# Patient Record
Sex: Female | Born: 1935 | Race: White | Hispanic: No | Marital: Married | Smoking: Current every day smoker
Health system: Southern US, Community
[De-identification: ages and names within clinical notes are randomized; demographics above are authoritative.]

## PROBLEM LIST (undated history)

## (undated) DIAGNOSIS — E079 Disorder of thyroid, unspecified: Secondary | ICD-10-CM

## (undated) DIAGNOSIS — M1990 Unspecified osteoarthritis, unspecified site: Secondary | ICD-10-CM

## (undated) HISTORY — PX: BREAST SURGERY: SHX581

## (undated) HISTORY — PX: FRACTURE SURGERY: SHX138

## (undated) HISTORY — DX: Disorder of thyroid, unspecified: E07.9

## (undated) HISTORY — PX: ABDOMINAL HYSTERECTOMY: SHX81

## (undated) HISTORY — PX: APPENDECTOMY: SHX54

## (undated) HISTORY — DX: Unspecified osteoarthritis, unspecified site: M19.90

---

## 1999-07-06 ENCOUNTER — Encounter: Payer: Self-pay | Admitting: Family Medicine

## 1999-07-06 ENCOUNTER — Encounter: Admission: RE | Admit: 1999-07-06 | Discharge: 1999-07-06 | Payer: Self-pay | Admitting: Family Medicine

## 1999-09-05 ENCOUNTER — Encounter: Admission: RE | Admit: 1999-09-05 | Discharge: 1999-09-05 | Payer: Self-pay | Admitting: Family Medicine

## 1999-09-05 ENCOUNTER — Encounter: Payer: Self-pay | Admitting: Family Medicine

## 2000-09-11 ENCOUNTER — Encounter: Payer: Self-pay | Admitting: Family Medicine

## 2000-09-11 ENCOUNTER — Encounter: Admission: RE | Admit: 2000-09-11 | Discharge: 2000-09-11 | Payer: Self-pay | Admitting: Family Medicine

## 2001-09-16 ENCOUNTER — Encounter: Payer: Self-pay | Admitting: Family Medicine

## 2001-09-16 ENCOUNTER — Encounter: Admission: RE | Admit: 2001-09-16 | Discharge: 2001-09-16 | Payer: Self-pay | Admitting: Family Medicine

## 2002-09-30 ENCOUNTER — Encounter: Payer: Self-pay | Admitting: Family Medicine

## 2002-09-30 ENCOUNTER — Encounter: Admission: RE | Admit: 2002-09-30 | Discharge: 2002-09-30 | Payer: Self-pay | Admitting: Family Medicine

## 2003-10-05 ENCOUNTER — Encounter: Admission: RE | Admit: 2003-10-05 | Discharge: 2003-10-05 | Payer: Self-pay | Admitting: Family Medicine

## 2004-10-18 ENCOUNTER — Encounter: Admission: RE | Admit: 2004-10-18 | Discharge: 2004-10-18 | Payer: Self-pay | Admitting: Family Medicine

## 2005-10-23 ENCOUNTER — Encounter: Admission: RE | Admit: 2005-10-23 | Discharge: 2005-10-23 | Payer: Self-pay | Admitting: Family Medicine

## 2006-10-15 ENCOUNTER — Encounter: Admission: RE | Admit: 2006-10-15 | Discharge: 2006-10-15 | Payer: Self-pay | Admitting: Family Medicine

## 2006-10-26 ENCOUNTER — Encounter: Admission: RE | Admit: 2006-10-26 | Discharge: 2006-10-26 | Payer: Self-pay | Admitting: Family Medicine

## 2007-11-27 ENCOUNTER — Encounter: Admission: RE | Admit: 2007-11-27 | Discharge: 2007-11-27 | Payer: Self-pay | Admitting: Family Medicine

## 2007-12-06 ENCOUNTER — Encounter: Admission: RE | Admit: 2007-12-06 | Discharge: 2007-12-06 | Payer: Self-pay | Admitting: Family Medicine

## 2008-11-30 ENCOUNTER — Encounter: Admission: RE | Admit: 2008-11-30 | Discharge: 2008-11-30 | Payer: Self-pay | Admitting: Family Medicine

## 2009-01-21 ENCOUNTER — Ambulatory Visit (HOSPITAL_COMMUNITY): Admission: RE | Admit: 2009-01-21 | Discharge: 2009-01-22 | Payer: Self-pay | Admitting: Obstetrics and Gynecology

## 2009-12-06 ENCOUNTER — Encounter: Admission: RE | Admit: 2009-12-06 | Discharge: 2009-12-06 | Payer: Self-pay | Admitting: Family Medicine

## 2010-09-18 ENCOUNTER — Encounter: Payer: Self-pay | Admitting: Family Medicine

## 2010-09-28 ENCOUNTER — Other Ambulatory Visit: Payer: Self-pay | Admitting: Family Medicine

## 2010-09-28 DIAGNOSIS — Z1239 Encounter for other screening for malignant neoplasm of breast: Secondary | ICD-10-CM

## 2010-12-06 LAB — BASIC METABOLIC PANEL
Calcium: 9.4 mg/dL (ref 8.4–10.5)
Chloride: 103 mEq/L (ref 96–112)
GFR calc Af Amer: >60 mL/min (ref >60)
Glucose, Bld: 87 mg/dL (ref 70–99)
Potassium: 3.4 mEq/L — ABNORMAL LOW (ref 3.5–5.1)

## 2010-12-06 LAB — URINALYSIS, ROUTINE W REFLEX MICROSCOPIC
Bilirubin Urine: NEGATIVE
Ketones, ur: NEGATIVE mg/dL
Nitrite: NEGATIVE
Protein, ur: NEGATIVE mg/dL
Specific Gravity, Urine: 1.015 (ref 1.005–1.030)
pH: 6 (ref 5.0–8.0)

## 2010-12-06 LAB — CBC
HCT: 45 % (ref 36.0–46.0)
Hemoglobin: 15.6 g/dL — ABNORMAL HIGH (ref 12.0–15.0)
MCV: 93.4 fL (ref 78.0–100.0)
MCV: 93.7 fL (ref 78.0–100.0)
Platelets: 317 10*3/uL (ref 150–400)
Platelets: 365 10*3/uL (ref 150–400)
RDW: 13.9 % (ref 11.5–15.5)
WBC: 15.3 10*3/uL — ABNORMAL HIGH (ref 4.0–10.5)

## 2010-12-06 LAB — URINE MICROSCOPIC-ADD ON

## 2010-12-12 ENCOUNTER — Ambulatory Visit
Admission: RE | Admit: 2010-12-12 | Discharge: 2010-12-12 | Disposition: A | Discharge status: Home / Self Care | Payer: MEDICARE | Source: Ambulatory Visit | Attending: Family Medicine | Admitting: Family Medicine

## 2010-12-12 DIAGNOSIS — Z1239 Encounter for other screening for malignant neoplasm of breast: Secondary | ICD-10-CM

## 2011-01-10 NOTE — Op Note (Signed)
Kim Flores, Kim Flores            ACCOUNT NO.:  [id number]   MEDICAL RECORD NO.:  [id number]          PATIENT TYPE:  OIB   LOCATION:  9311                          FACILITY:  [facility]   PHYSICIAN:  Gerald Leitz, MD          DATE OF BIRTH:  12-29-1929   DATE OF PROCEDURE:  01/21/2009  DATE OF DISCHARGE:                               OPERATIVE REPORT   PREOPERATIVE DIAGNOSES:  1. Cystocele.  2. Urinary incontinence.   POSTOPERATIVE DIAGNOSES:  1. Cystocele.  2. Urinary incontinence.   PROCEDURE:  Anterior repair.   SURGEON:  Gerald Leitz, MD   ASSISTANT:  Bing Neighbors. Sydnee Cabal, MD   ANESTHESIA:  General.   FINDINGS:  Moderate cystocele.   SPECIMEN:  None.   ESTIMATED BLOOD LOSS:  25 mL.   URINE OUTPUT:  200 mL.   COMPLICATIONS:  None.   FLUIDS:  Per anesthesia.   PROCEDURE:  The patient was taken to the operating room where she was  placed under general anesthesia.  She was prepped and draped in the  usual sterile fashion.  Foley catheter was placed.  She was placed in  dorsal lithotomy position.  Allis clamp was placed at the cornu and used  for retraction.  Lidocaine 1% with epi was injected along the anterior  aspect of the vaginal mucosa and a transverse incision was made in the  vaginal cuff.  The vaginal mucosa was then incised with Metzenbaum  scissors.  The vaginal mucosa was dissected off cervicopubic fascia with  metzenbaum scissors. .  The vaginal mucosa was grasped with Allis  clamps. .  The  fascial defect was repaired with interrupted stitches of  2-0 Vicryl.  Excellent hemostasis was noted.  The redundant vaginal  tissue was excised and  the vaginal mucosa was reapproximated with 2-0 Vicryl in a running  locked fashion.  A lateral vaginal pack moistened with Estrace cream and  was packed into the vaginal vault.  The patient was awakened from  anesthesia and taken to recovery room awake and in stable condition.      Gerald Leitz, MD  Electronically  Signed     TC/MEDQ  D:  01/21/2009  T:  01/22/2009  Job:  [id number]

## 2011-09-29 ENCOUNTER — Other Ambulatory Visit: Payer: Self-pay | Admitting: Family Medicine

## 2011-09-29 DIAGNOSIS — Z1231 Encounter for screening mammogram for malignant neoplasm of breast: Secondary | ICD-10-CM

## 2011-12-18 ENCOUNTER — Ambulatory Visit
Admission: RE | Admit: 2011-12-18 | Discharge: 2011-12-18 | Disposition: A | Discharge status: Home / Self Care | Payer: Medicare Other | Source: Ambulatory Visit | Attending: Family Medicine | Admitting: Family Medicine

## 2011-12-18 DIAGNOSIS — Z1231 Encounter for screening mammogram for malignant neoplasm of breast: Secondary | ICD-10-CM

## 2012-10-14 ENCOUNTER — Other Ambulatory Visit: Payer: Self-pay | Admitting: Family Medicine

## 2012-10-14 DIAGNOSIS — Z1231 Encounter for screening mammogram for malignant neoplasm of breast: Secondary | ICD-10-CM

## 2012-12-23 ENCOUNTER — Ambulatory Visit
Admission: RE | Admit: 2012-12-23 | Discharge: 2012-12-23 | Disposition: A | Discharge status: Home / Self Care | Payer: Medicare Other | Source: Ambulatory Visit | Attending: Family Medicine | Admitting: Family Medicine

## 2012-12-23 DIAGNOSIS — Z1231 Encounter for screening mammogram for malignant neoplasm of breast: Secondary | ICD-10-CM

## 2013-05-01 ENCOUNTER — Ambulatory Visit
Admission: RE | Admit: 2013-05-01 | Discharge: 2013-05-01 | Disposition: A | Discharge status: Home / Self Care | Payer: Medicare Other | Source: Ambulatory Visit | Attending: Family Medicine | Admitting: Family Medicine

## 2013-05-01 ENCOUNTER — Other Ambulatory Visit: Payer: Self-pay | Admitting: Family Medicine

## 2013-05-01 DIAGNOSIS — R634 Abnormal weight loss: Secondary | ICD-10-CM

## 2013-11-17 ENCOUNTER — Other Ambulatory Visit: Payer: Self-pay

## 2013-11-17 DIAGNOSIS — Z1231 Encounter for screening mammogram for malignant neoplasm of breast: Secondary | ICD-10-CM

## 2013-12-24 ENCOUNTER — Encounter (INDEPENDENT_AMBULATORY_CARE_PROVIDER_SITE_OTHER): Payer: Self-pay

## 2013-12-24 ENCOUNTER — Ambulatory Visit
Admission: RE | Admit: 2013-12-24 | Discharge: 2013-12-24 | Disposition: A | Discharge status: Home / Self Care | Payer: Medicare Other | Source: Ambulatory Visit

## 2013-12-24 DIAGNOSIS — Z1231 Encounter for screening mammogram for malignant neoplasm of breast: Secondary | ICD-10-CM

## 2014-03-14 ENCOUNTER — Ambulatory Visit (INDEPENDENT_AMBULATORY_CARE_PROVIDER_SITE_OTHER): Payer: BC Managed Care – HMO | Admitting: Emergency Medicine

## 2014-03-14 VITALS — BP 98/60 | HR 102 | Temp 98.2°F | Resp 20 | Ht 60.0 in | Wt 97.0 lb

## 2014-03-14 DIAGNOSIS — M109 Gout, unspecified: Secondary | ICD-10-CM

## 2014-03-14 MED ORDER — COLCHICINE 0.6 MG PO TABS: ORAL_TABLET | ORAL | Status: DC

## 2014-03-14 MED ORDER — INDOMETHACIN 25 MG PO CAPS: 25.0000 mg | ORAL_CAPSULE | Freq: Three times a day (TID) | ORAL | Status: AC

## 2014-03-14 MED ORDER — INDOMETHACIN 25 MG SUPPOSITORY: 25.0000 mg | Freq: Three times a day (TID) | RECTAL | Status: DC

## 2014-03-14 NOTE — Progress Notes (Signed)
   [facility] [address], [place] [id number] [phone]  Date:  03/14/2014   Name:  Kim ServerChristine V Flores   DOB:  07/01/1930   MRN:  [id number]  PCP:  No PCP Per Patient    Chief Complaint: Knee Pain   History of Present Illness:  Kim ServerChristine V Flores is a 80 y.o. very pleasant female patient who presents with the following:  Patient has swelling and redness in the right knee joint ober the past month or so.  Has no history of injury or overuses.  Now has swelling and redness of the knee joint.  Tender.  Causes delliberate posture.Marland Kitchen.Marland Kitchen.No improvement with over the counter medications or other home remedies. Denies other complaint or health concern today. .  There are no active problems to display for this patient.   Past Medical History  Diagnosis Date  . Arthritis   . Thyroid disease     Past Surgical History  Procedure Laterality Date  . Appendectomy    . Abdominal hysterectomy    . Breast surgery      History  Substance Use Topics  . Smoking status: Current Every Day Smoker  . Smokeless tobacco: Never Used  . Alcohol Use: No    No family history on file.  No Known Allergies  Medication list has been reviewed and updated.  No current outpatient prescriptions on file prior to visit.   No current facility-administered medications on file prior to visit.    Review of Systems:  As per HPI, otherwise negative.    Physical Examination: Filed Vitals:   03/14/14 1200  BP: 98/60  Pulse: 102  Temp: 98.2 F (36.8 C)  Resp: 20   Filed Vitals:   03/14/14 1200  Height: 5' (1.524 m)  Weight: 97 lb (43.999 kg)   Body mass index is 18.94 kg/(m^2). Ideal Body Weight: Weight in (lb) to have BMI = 25: 127.7   GEN: WDWN, NAD, Non-toxic, Alert & Oriented x 3 HEENT: Atraumatic, Normocephalic.  Ears and Nose: No external deformity. EXTR: No clubbing/cyanosis/edema NEURO: deliberate gait.  PSYCH: Normally interactive. Conversant. Not  depressed or anxious appearing.  Calm demeanor.  Right Knee swollen warm and tender medially no ecchymosis or deformity.  Rather erythamatous.  guards  Assessment and Plan: Got Colchicine Indocin  Signed,  Phillips OdorJeffery Jamekia Gannett, MD

## 2014-03-14 NOTE — Addendum Note (Signed)
Addended by: Johnnette LitterARDWELL, Genessis Flanary M on: 03/14/2014 12:51 PM   Modules accepted: Orders, Medications

## 2014-03-14 NOTE — Patient Instructions (Signed)

## 2014-03-16 ENCOUNTER — Telehealth: Payer: Self-pay

## 2014-03-16 NOTE — Telephone Encounter (Signed)
Colchicine is known to cause diarrhea. If her pain is improved, stay off the colchicine.   Indocin can be continued, be sure to take it with food to prevent nausea.

## 2014-03-16 NOTE — Telephone Encounter (Signed)
WARREN STATES THE MEDICATION HIS WIFE WAS GIVEN FOR GOUT IS CAUSING HER TO HAVE DIARRHEA REALLY BAD, WOULD LIKE TO KNOW WHAT TO DO PLEASE CALL [phone]

## 2014-03-16 NOTE — Telephone Encounter (Signed)
Advised pt to stop medication. Pt husband concerned the medication for gout has caused the diarrhea. Advised husband to make sure pt increases water consumption today. Pt husband states her legs are feeling much better today. Again advised pt to stop taking medication.

## 2014-03-17 NOTE — Telephone Encounter (Signed)
Pt.notified

## 2014-03-23 ENCOUNTER — Other Ambulatory Visit: Payer: Self-pay | Admitting: Family Medicine

## 2014-03-23 ENCOUNTER — Ambulatory Visit
Admission: RE | Admit: 2014-03-23 | Discharge: 2014-03-23 | Disposition: A | Discharge status: Home / Self Care | Payer: Medicare Other | Source: Ambulatory Visit | Attending: Family Medicine | Admitting: Family Medicine

## 2014-03-23 DIAGNOSIS — R609 Edema, unspecified: Secondary | ICD-10-CM

## 2014-08-31 ENCOUNTER — Other Ambulatory Visit: Payer: Self-pay | Admitting: Family Medicine

## 2014-08-31 ENCOUNTER — Encounter (HOSPITAL_COMMUNITY): Payer: Self-pay | Admitting: *Deleted

## 2014-08-31 ENCOUNTER — Emergency Department (HOSPITAL_COMMUNITY)
Admission: EM | Admit: 2014-08-31 | Discharge: 2014-08-31 | Disposition: A | Discharge status: Home / Self Care | Payer: Medicare Other | Attending: Emergency Medicine | Admitting: Emergency Medicine

## 2014-08-31 ENCOUNTER — Other Ambulatory Visit (HOSPITAL_COMMUNITY): Payer: Self-pay | Admitting: Family Medicine

## 2014-08-31 ENCOUNTER — Ambulatory Visit (HOSPITAL_COMMUNITY)
Admission: RE | Admit: 2014-08-31 | Discharge: 2014-08-31 | Disposition: A | Discharge status: Home / Self Care | Payer: Medicare Other | Source: Ambulatory Visit | Attending: Family Medicine | Admitting: Family Medicine

## 2014-08-31 ENCOUNTER — Other Ambulatory Visit: Payer: Self-pay

## 2014-08-31 ENCOUNTER — Ambulatory Visit
Admission: RE | Admit: 2014-08-31 | Discharge: 2014-08-31 | Disposition: A | Discharge status: Home / Self Care | Payer: Self-pay | Source: Ambulatory Visit | Attending: Family Medicine | Admitting: Family Medicine

## 2014-08-31 ENCOUNTER — Emergency Department (HOSPITAL_COMMUNITY): Payer: Medicare Other

## 2014-08-31 DIAGNOSIS — Z720 Tobacco use: Secondary | ICD-10-CM | POA: Insufficient documentation

## 2014-08-31 DIAGNOSIS — I82402 Acute embolism and thrombosis of unspecified deep veins of left lower extremity: Secondary | ICD-10-CM

## 2014-08-31 DIAGNOSIS — R600 Localized edema: Secondary | ICD-10-CM | POA: Diagnosis present

## 2014-08-31 DIAGNOSIS — M7989 Other specified soft tissue disorders: Secondary | ICD-10-CM

## 2014-08-31 DIAGNOSIS — E079 Disorder of thyroid, unspecified: Secondary | ICD-10-CM | POA: Insufficient documentation

## 2014-08-31 DIAGNOSIS — M79609 Pain in unspecified limb: Secondary | ICD-10-CM

## 2014-08-31 DIAGNOSIS — R609 Edema, unspecified: Secondary | ICD-10-CM

## 2014-08-31 DIAGNOSIS — W19XXXA Unspecified fall, initial encounter: Secondary | ICD-10-CM

## 2014-08-31 DIAGNOSIS — M79605 Pain in left leg: Secondary | ICD-10-CM | POA: Diagnosis present

## 2014-08-31 DIAGNOSIS — Z9181 History of falling: Secondary | ICD-10-CM | POA: Insufficient documentation

## 2014-08-31 DIAGNOSIS — Z791 Long term (current) use of non-steroidal anti-inflammatories (NSAID): Secondary | ICD-10-CM | POA: Diagnosis not present

## 2014-08-31 DIAGNOSIS — Z79899 Other long term (current) drug therapy: Secondary | ICD-10-CM | POA: Diagnosis not present

## 2014-08-31 DIAGNOSIS — I82409 Acute embolism and thrombosis of unspecified deep veins of unspecified lower extremity: Secondary | ICD-10-CM | POA: Diagnosis not present

## 2014-08-31 DIAGNOSIS — M79606 Pain in leg, unspecified: Secondary | ICD-10-CM | POA: Diagnosis present

## 2014-08-31 LAB — I-STAT CHEM 8, ED
BUN: 10 mg/dL (ref 6–23)
CALCIUM ION: 1.12 mmol/L — AB (ref 1.13–1.30)
Chloride: 98 mEq/L (ref 96–112)
Creatinine, Ser: 0.5 mg/dL (ref 0.50–1.10)
Glucose, Bld: 92 mg/dL (ref 70–99)
HEMATOCRIT: 51 % — AB (ref 36.0–46.0)
HEMOGLOBIN: 17.3 g/dL — AB (ref 12.0–15.0)
POTASSIUM: 3.5 mmol/L (ref 3.5–5.1)
Sodium: 143 mmol/L (ref 135–145)
TCO2: 27 mmol/L (ref 0–100)

## 2014-08-31 MED ORDER — XARELTO VTE STARTER PACK 15 & 20 MG PO TBPK: 15.0000 mg | ORAL_TABLET | ORAL | Status: AC

## 2014-08-31 MED ORDER — RIVAROXABAN 15 MG PO TABS
15.0000 mg | ORAL_TABLET | Freq: Once | ORAL | Status: AC
Start: 2014-08-31 — End: 2014-08-31
  Administered 2014-08-31: 15 mg via ORAL
  Filled 2014-08-31: qty 1

## 2014-08-31 NOTE — ED Notes (Addendum)
Pt has no complaints other than recent left leg swelling. Went for vascular study and was positive for dvt. spo2 88% at triage on room air.

## 2014-08-31 NOTE — Discharge Instructions (Signed)
As discussed, with your newly diagnosed thrombosis or clots in your left leg is very important to take all medication as directed, and be sure to follow-up with her primary care physician. Please be sure to call tomorrow to discuss our medical management, as well as to arrange appropriate ongoing care.  Recall that we have elected to forego CAT scan of your chest, as this was unlikely to change the medication provided for your thrombosis.  Return here for any concerning changes in your condition.

## 2014-08-31 NOTE — ED Provider Notes (Signed)
CSN: [id number]     Arrival date & time 08/31/14  1556 History   First MD Initiated Contact with Patient 08/31/14 1625     Chief Complaint  Patient presents with  . DVT     HPI  Patient presents with concern of persistent left leg swelling, pain. Patient had evaluation as an outpatient today for the pain, was found to have DVT She was referred here for treatment and evaluation. Patient had a mechanical fall 2 weeks ago.  During the fall she suffered a fractured obturator ring, as well as trauma to the left distal leg. Patient has been ambulatory since the fall, denies head trauma, or any subsequent changes in cognition or neurologic status. However, she has had increasing pain, ecchymosis throughout the distal left lower extremity. Today, patient went to have evaluation, was found to have DVT from the mid left femoral distally. Patient was also found to be hypoxic during that evaluation, though she continues to deny any CP / dyspnea. She was sent here for eval.   Past Medical History  Diagnosis Date  . Arthritis   . Thyroid disease    Past Surgical History  Procedure Laterality Date  . Appendectomy    . Abdominal hysterectomy    . Breast surgery     History reviewed. No pertinent family history. History  Substance Use Topics  . Smoking status: Current Every Day Smoker  . Smokeless tobacco: Never Used  . Alcohol Use: No   OB History    No data available     Review of Systems  Constitutional:       Per HPI, otherwise negative  HENT:       Per HPI, otherwise negative  Respiratory:       Per HPI, otherwise negative  Cardiovascular:       Per HPI, otherwise negative  Gastrointestinal: Negative for vomiting.  Endocrine:       Negative aside from HPI  Genitourinary:       Neg aside from HPI   Musculoskeletal:       Per HPI, otherwise negative  Skin: Positive for color change and wound.  Neurological: Negative for syncope.      Allergies  Review of patient's  allergies indicates no known allergies.  Home Medications   Prior to Admission medications   Medication Sig Start Date End Date Taking? Authorizing Provider  amLODipine (NORVASC) 5 MG tablet Take 5 mg by mouth daily.    Historical Provider, MD  Calcium-Vitamin D (CALTRATE 600 PLUS-VIT D PO) Take 1 tablet by mouth 2 (two) times daily.    Historical Provider, MD  colchicine 0.6 MG tablet 2 now and 1 in one hour.  Tomorrow 1 po bid 03/14/14   Carmelina Dane, MD  estropipate (OGEN) 1.5 MG tablet Take 1.5 mg by mouth daily.    Historical Provider, MD  indomethacin (INDOCIN) 25 MG capsule Take 1 capsule (25 mg total) by mouth 3 (three) times daily with meals. 03/14/14   Carmelina Dane, MD  levothyroxine (SYNTHROID, LEVOTHROID) 88 MCG tablet Take 88 mcg by mouth daily before breakfast.    Historical Provider, MD  Multiple Vitamins-Minerals (CENTRUM SILVER ADULT 50+) TABS Take 1 tablet by mouth daily.    Historical Provider, MD   BP 133/74 mmHg  Pulse 102  Temp(Src) 98.2 F (36.8 C) (Oral)  Resp 12  SpO2 90% Physical Exam  Constitutional: She is oriented to person, place, and time. She appears well-developed and well-nourished. No distress.  HENT:  Head: Normocephalic and atraumatic.  Eyes: Conjunctivae and EOM are normal.  Cardiovascular: Normal rate and regular rhythm.   Pulmonary/Chest: Effort normal and breath sounds normal. No stridor. No respiratory distress.  Abdominal: She exhibits no distension.  Musculoskeletal: She exhibits edema and tenderness.  Aside from the LLE, musculoskeletal exam is unremarkable.  Neurological: She is alert and oriented to person, place, and time. No cranial nerve deficit.  Skin:  Distal LLE w circumferential ecchymosis, ttp. She moves the ankle and foot freely, and has appropriate sensation.   Psychiatric: She has a normal mood and affect.  Nursing note and vitals reviewed.   ED Course  Procedures (including critical care time) Labs  Review Labs Reviewed  I-STAT CHEM 8, ED    Imaging Review Dg Lumbar Spine Complete  08/31/2014   CLINICAL DATA:  81 year old female with fall 2 weeks ago on porch. Low back pain. Initial encounter.  EXAM: LUMBAR SPINE - COMPLETE 4+ VIEW  COMPARISON:  No comparison lumbar spine exam. Comparison chest x-ray 05/01/2013.  FINDINGS: Scoliosis lumbar spine convex the left.  Significant degenerative changes L1-2 through L5-S1.  12 mm anterior slippage of L4 with appearance of impaction of the L4 inferior endplate into the L5 superior endplate. Scoliosis limits evaluation at this level. CT imaging can be obtained for further delineation determine if this represents an acute abnormality versus result of chronic changes.  Significant spinal stenosis and foraminal narrowing L4-5 and L5-S1 level suspected.  Calcified ectatic aorta.  On one view questionable abnormality of the right S1 neural foramen not confirmed on other views as a true abnormality and therefore felt to be related to artifact.  IMPRESSION: 12 mm anterior slippage of L4 with appearance of impaction of the L4 inferior endplate into the L5 superior endplate. Scoliosis limits evaluation at this level. CT imaging can be obtained for further delineation to help determine if this represents an acute abnormality versus result of chronic changes.  Significant spinal stenosis and foraminal narrowing L4-5 and L5-S1 level suspected.  Marked disc space narrowing L1-2 through L3-4  Calcified ectatic aorta.  This is a call report.   Electronically Signed   By: Bridgett Larsson M.D.   On: 08/31/2014 13:13   Dg Hip Unilat With Pelvis 1v Left  08/31/2014   CLINICAL DATA:  Fall 2 weeks ago at home. Low back pain and LEFT hip pain.  EXAM: RADIOLOGY EXAMINATION LEFT HIP 2V  COMPARISON:  None.  FINDINGS: Chondrocalcinosis of the acetabular labrum bilaterally. DISH. Lumbar spondylosis is present. Refer to lumbar spine radiographs same day.  There is a subtle nondisplaced  fracture of the LEFT obturator ring present with cortical step-off along the iliopubic line and irregularity through the LEFT inferior pubic ramus.  Sacral arcades appear within normal limits. These fractures are commonly associated with sacral fractures although none are visible radiographically. If further evaluation is warranted, MRI of the pelvis without contrast should be performed.  IMPRESSION: Nondisplaced LEFT obturator ring fracture.   Electronically Signed   By: Andreas Newport M.D.   On: 08/31/2014 13:07    O2- 99% on [place], abnormal  -patient will be switched to RA for reassessment  Update: On repeat exam the patient is in no distress, continues to deny any chest pain, dyspnea. Pulse oxygen is 97% on room air.  Update: Patient remains in no distress, with no complaint. Pulse ox symmetry fluctuate between low 90s, mid 90s on room air.  Update: The patient, her husband and I discussed  all findings, including newly diagnosed DVT, labs. We had a lengthy conversation about anticoagulants, as well as risks and benefits of CT angiography to completely exclude the possibility of PE, though the patient has no chest pain, and no dyspnea. With no likely change medication even with diagnosis of PE, we all agreed to decrease the possibility of renal competition secondary to IV dye, proceed with initiation of anticoagulants, have the patient follow up with her physician.   MDM   Patient presents with new deep vein thrombosis of the left lower extremity. No evidence for phlegmasia cerulea dolens or Albans, nor any distal neurovascular loss. Patient has fluctuating pulse oximetry, though this seems likely artifact, as she is not hypoxic, and has no chest pain. After a lengthy conversation with the patient and her husband about findings, medications, possible risks of new anticoagulants, the patient was discharged after initiation of her medications to follow-up with primary care.    Gerhard Munch, MD 08/31/14 [phone]

## 2014-08-31 NOTE — Progress Notes (Signed)
VASCULAR LAB PRELIMINARY  PRELIMINARY  PRELIMINARY  PRELIMINARY  Bilateral lower venous doppler completed.    Preliminary report: POsitive DVT LLEV extending from the left mid femoral vein to calf veins.   Negative DVT RLEV (for comparison). Patient is midly confused.   Left calf is edematous, discolored and painful.   Redmond Pulling, RVT 08/31/2014, 3:42 PM

## 2014-11-13 ENCOUNTER — Other Ambulatory Visit: Payer: Self-pay

## 2014-11-13 DIAGNOSIS — Z1231 Encounter for screening mammogram for malignant neoplasm of breast: Secondary | ICD-10-CM

## 2014-12-28 ENCOUNTER — Ambulatory Visit
Admission: RE | Admit: 2014-12-28 | Discharge: 2014-12-28 | Disposition: A | Discharge status: Home / Self Care | Payer: Medicare Other | Source: Ambulatory Visit

## 2014-12-28 DIAGNOSIS — Z1231 Encounter for screening mammogram for malignant neoplasm of breast: Secondary | ICD-10-CM

## 2015-05-12 ENCOUNTER — Emergency Department (HOSPITAL_COMMUNITY): Payer: Medicare Other

## 2015-05-12 ENCOUNTER — Encounter (HOSPITAL_COMMUNITY): Payer: Self-pay | Admitting: Emergency Medicine

## 2015-05-12 ENCOUNTER — Inpatient Hospital Stay (HOSPITAL_COMMUNITY)
Admission: EM | Admit: 2015-05-12 | Discharge: 2015-05-29 | DRG: 175 | Disposition: E | Payer: Medicare Other | Attending: Internal Medicine | Admitting: Internal Medicine

## 2015-05-12 DIAGNOSIS — J9602 Acute respiratory failure with hypercapnia: Secondary | ICD-10-CM | POA: Diagnosis present

## 2015-05-12 DIAGNOSIS — J90 Pleural effusion, not elsewhere classified: Secondary | ICD-10-CM

## 2015-05-12 DIAGNOSIS — S0093XA Contusion of unspecified part of head, initial encounter: Secondary | ICD-10-CM | POA: Diagnosis present

## 2015-05-12 DIAGNOSIS — R000 Tachycardia, unspecified: Secondary | ICD-10-CM | POA: Diagnosis present

## 2015-05-12 DIAGNOSIS — F17210 Nicotine dependence, cigarettes, uncomplicated: Secondary | ICD-10-CM | POA: Diagnosis present

## 2015-05-12 DIAGNOSIS — Z86718 Personal history of other venous thrombosis and embolism: Secondary | ICD-10-CM

## 2015-05-12 DIAGNOSIS — Z7901 Long term (current) use of anticoagulants: Secondary | ICD-10-CM

## 2015-05-12 DIAGNOSIS — W19XXXA Unspecified fall, initial encounter: Secondary | ICD-10-CM | POA: Diagnosis present

## 2015-05-12 DIAGNOSIS — R0902 Hypoxemia: Secondary | ICD-10-CM

## 2015-05-12 DIAGNOSIS — Z515 Encounter for palliative care: Secondary | ICD-10-CM

## 2015-05-12 DIAGNOSIS — E440 Moderate protein-calorie malnutrition: Secondary | ICD-10-CM | POA: Diagnosis present

## 2015-05-12 DIAGNOSIS — M1990 Unspecified osteoarthritis, unspecified site: Secondary | ICD-10-CM | POA: Diagnosis present

## 2015-05-12 DIAGNOSIS — I959 Hypotension, unspecified: Secondary | ICD-10-CM | POA: Diagnosis not present

## 2015-05-12 DIAGNOSIS — E870 Hyperosmolality and hypernatremia: Secondary | ICD-10-CM

## 2015-05-12 DIAGNOSIS — J9690 Respiratory failure, unspecified, unspecified whether with hypoxia or hypercapnia: Secondary | ICD-10-CM | POA: Diagnosis present

## 2015-05-12 DIAGNOSIS — Z79899 Other long term (current) drug therapy: Secondary | ICD-10-CM

## 2015-05-12 DIAGNOSIS — G9340 Encephalopathy, unspecified: Secondary | ICD-10-CM | POA: Diagnosis present

## 2015-05-12 DIAGNOSIS — J9692 Respiratory failure, unspecified with hypercapnia: Secondary | ICD-10-CM

## 2015-05-12 DIAGNOSIS — R296 Repeated falls: Secondary | ICD-10-CM | POA: Diagnosis present

## 2015-05-12 DIAGNOSIS — F0390 Unspecified dementia without behavioral disturbance: Secondary | ICD-10-CM | POA: Diagnosis present

## 2015-05-12 DIAGNOSIS — I2699 Other pulmonary embolism without acute cor pulmonale: Secondary | ICD-10-CM | POA: Diagnosis present

## 2015-05-12 DIAGNOSIS — J9601 Acute respiratory failure with hypoxia: Secondary | ICD-10-CM | POA: Diagnosis present

## 2015-05-12 DIAGNOSIS — N390 Urinary tract infection, site not specified: Secondary | ICD-10-CM

## 2015-05-12 DIAGNOSIS — J9691 Respiratory failure, unspecified with hypoxia: Secondary | ICD-10-CM

## 2015-05-12 DIAGNOSIS — F419 Anxiety disorder, unspecified: Secondary | ICD-10-CM | POA: Diagnosis present

## 2015-05-12 DIAGNOSIS — E039 Hypothyroidism, unspecified: Secondary | ICD-10-CM

## 2015-05-12 DIAGNOSIS — R0602 Shortness of breath: Secondary | ICD-10-CM | POA: Diagnosis not present

## 2015-05-12 DIAGNOSIS — F0391 Unspecified dementia with behavioral disturbance: Secondary | ICD-10-CM | POA: Diagnosis present

## 2015-05-12 DIAGNOSIS — J690 Pneumonitis due to inhalation of food and vomit: Secondary | ICD-10-CM | POA: Diagnosis present

## 2015-05-12 DIAGNOSIS — R0600 Dyspnea, unspecified: Secondary | ICD-10-CM

## 2015-05-12 DIAGNOSIS — F03918 Unspecified dementia, unspecified severity, with other behavioral disturbance: Secondary | ICD-10-CM | POA: Insufficient documentation

## 2015-05-12 DIAGNOSIS — Z66 Do not resuscitate: Secondary | ICD-10-CM | POA: Diagnosis present

## 2015-05-12 DIAGNOSIS — I10 Essential (primary) hypertension: Secondary | ICD-10-CM | POA: Diagnosis present

## 2015-05-12 DIAGNOSIS — R627 Adult failure to thrive: Secondary | ICD-10-CM | POA: Diagnosis present

## 2015-05-12 DIAGNOSIS — J189 Pneumonia, unspecified organism: Secondary | ICD-10-CM

## 2015-05-12 DIAGNOSIS — E038 Other specified hypothyroidism: Secondary | ICD-10-CM | POA: Diagnosis not present

## 2015-05-12 DIAGNOSIS — F0150 Vascular dementia without behavioral disturbance: Secondary | ICD-10-CM | POA: Diagnosis present

## 2015-05-12 LAB — CBC WITH DIFFERENTIAL/PLATELET
Basophils Absolute: 0 10*3/uL (ref 0.0–0.1)
Basophils Relative: 1 %
EOS PCT: 1 %
Eosinophils Absolute: 0 10*3/uL (ref 0.0–0.7)
HEMATOCRIT: 46.5 % — AB (ref 36.0–46.0)
Hemoglobin: 13.7 g/dL (ref 12.0–15.0)
Lymphocytes Relative: 14 %
Lymphs Abs: 1.2 10*3/uL (ref 0.7–4.0)
MCH: 27 pg (ref 26.0–34.0)
MCHC: 29.5 g/dL — ABNORMAL LOW (ref 30.0–36.0)
MCV: 91.7 fL (ref 78.0–100.0)
Monocytes Absolute: 1.1 10*3/uL — ABNORMAL HIGH (ref 0.1–1.0)
Monocytes Relative: 13 %
Neutro Abs: 6.3 10*3/uL (ref 1.7–7.7)
Neutrophils Relative %: 73 %
PLATELETS: 390 10*3/uL (ref 150–400)
RBC: 5.07 MIL/uL (ref 3.87–5.11)
RDW: 16.9 % — ABNORMAL HIGH (ref 11.5–15.5)
WBC: 8.6 10*3/uL (ref 4.0–10.5)

## 2015-05-12 LAB — MRSA PCR SCREENING: MRSA BY PCR: NEGATIVE

## 2015-05-12 LAB — URINALYSIS, ROUTINE W REFLEX MICROSCOPIC
Bilirubin Urine: NEGATIVE
Glucose, UA: NEGATIVE mg/dL
Hgb urine dipstick: NEGATIVE
Ketones, ur: NEGATIVE mg/dL
NITRITE: POSITIVE — AB
Protein, ur: 30 mg/dL — AB
SPECIFIC GRAVITY, URINE: 1.014 (ref 1.005–1.030)
Urobilinogen, UA: 0.2 mg/dL (ref 0.0–1.0)
pH: 6 (ref 5.0–8.0)

## 2015-05-12 LAB — TROPONIN I: TROPONIN I: 0.03 ng/mL (ref <0.031)

## 2015-05-12 LAB — URINE MICROSCOPIC-ADD ON

## 2015-05-12 LAB — BLOOD GAS, ARTERIAL
ACID-BASE EXCESS: 5.3 mmol/L — AB (ref 0.0–2.0)
Bicarbonate: 33.3 mEq/L — ABNORMAL HIGH (ref 20.0–24.0)
DRAWN BY: 331471
O2 Content: 10 L/min
O2 SAT: 98.9 %
PATIENT TEMPERATURE: 98.6
PCO2 ART: 67.6 mmHg — AB (ref 35.0–45.0)
TCO2: 30.1 mmol/L (ref 0–100)
pH, Arterial: 7.313 — ABNORMAL LOW (ref 7.350–7.450)
pO2, Arterial: 155 mmHg — ABNORMAL HIGH (ref 80.0–100.0)

## 2015-05-12 LAB — COMPREHENSIVE METABOLIC PANEL
ALT: 32 U/L (ref 14–54)
AST: 36 U/L (ref 15–41)
Albumin: 3.5 g/dL (ref 3.5–5.0)
Alkaline Phosphatase: 70 U/L (ref 38–126)
Anion gap: 7 (ref 5–15)
BUN: 15 mg/dL (ref 6–20)
CHLORIDE: 101 mmol/L (ref 101–111)
CO2: 35 mmol/L — AB (ref 22–32)
CREATININE: 0.56 mg/dL (ref 0.44–1.00)
Calcium: 9.1 mg/dL (ref 8.9–10.3)
GFR calc Af Amer: >60 mL/min (ref >60)
GFR calc non Af Amer: >60 mL/min (ref >60)
Glucose, Bld: 104 mg/dL — ABNORMAL HIGH (ref 65–99)
Potassium: 3.9 mmol/L (ref 3.5–5.1)
SODIUM: 143 mmol/L (ref 135–145)
Total Bilirubin: 0.5 mg/dL (ref 0.3–1.2)
Total Protein: 7.3 g/dL (ref 6.5–8.1)

## 2015-05-12 LAB — BRAIN NATRIURETIC PEPTIDE: B NATRIURETIC PEPTIDE 5: 655.6 pg/mL — AB (ref 0.0–100.0)

## 2015-05-12 LAB — I-STAT CG4 LACTIC ACID, ED: Lactic Acid, Venous: 1.1 mmol/L (ref 0.5–2.0)

## 2015-05-12 LAB — TSH: TSH: 0.344 u[IU]/mL — ABNORMAL LOW (ref 0.350–4.500)

## 2015-05-12 LAB — CBG MONITORING, ED: Glucose-Capillary: 102 mg/dL — ABNORMAL HIGH (ref 65–99)

## 2015-05-12 MED ORDER — ONDANSETRON HCL 4 MG/2ML IJ SOLN: 4.0000 mg | Freq: Four times a day (QID) | INTRAMUSCULAR | Status: DC | PRN

## 2015-05-12 MED ORDER — HEPARIN BOLUS VIA INFUSION
1400.0000 [IU] | Freq: Once | INTRAVENOUS | Status: AC
  Administered 2015-05-12: 1400 [IU] via INTRAVENOUS
  Filled 2015-05-12: qty 1400

## 2015-05-12 MED ORDER — MORPHINE SULFATE (PF) 2 MG/ML IV SOLN
2.0000 mg | INTRAVENOUS | Status: DC | PRN
  Administered 2015-05-12: 2 mg via INTRAVENOUS
  Filled 2015-05-12 (×2): qty 1

## 2015-05-12 MED ORDER — OXYCODONE HCL 5 MG PO TABS: 5.0000 mg | ORAL_TABLET | ORAL | Status: DC | PRN

## 2015-05-12 MED ORDER — LIP MEDEX EX OINT
TOPICAL_OINTMENT | Freq: Once | CUTANEOUS | Status: DC
  Filled 2015-05-12: qty 7

## 2015-05-12 MED ORDER — LEVOTHYROXINE SODIUM 50 MCG PO TABS
100.0000 ug | ORAL_TABLET | Freq: Every day | ORAL | Status: DC
  Administered 2015-05-14 – 2015-05-15 (×2): 100 ug via ORAL
  Filled 2015-05-12 (×2): qty 2

## 2015-05-12 MED ORDER — SODIUM CHLORIDE 0.9 % IV SOLN: 250.0000 mL | INTRAVENOUS | Status: DC | PRN

## 2015-05-12 MED ORDER — SENNOSIDES-DOCUSATE SODIUM 8.6-50 MG PO TABS
1.0000 | ORAL_TABLET | Freq: Every evening | ORAL | Status: DC | PRN
Start: 2015-05-12 — End: 2015-05-17

## 2015-05-12 MED ORDER — IOHEXOL 350 MG/ML SOLN
100.0000 mL | Freq: Once | INTRAVENOUS | Status: AC | PRN
  Administered 2015-05-12: 100 mL via INTRAVENOUS

## 2015-05-12 MED ORDER — ACETAMINOPHEN 325 MG PO TABS
650.0000 mg | ORAL_TABLET | Freq: Four times a day (QID) | ORAL | Status: DC | PRN
  Administered 2015-05-14 – 2015-05-15 (×2): 650 mg via ORAL
  Filled 2015-05-12 (×2): qty 2

## 2015-05-12 MED ORDER — ACETAMINOPHEN 650 MG RE SUPP
650.0000 mg | Freq: Four times a day (QID) | RECTAL | Status: DC | PRN
  Administered 2015-05-14: 650 mg via RECTAL
  Filled 2015-05-12: qty 1

## 2015-05-12 MED ORDER — DEXTROSE 5 % IV SOLN
1.0000 g | Freq: Once | INTRAVENOUS | Status: AC
  Administered 2015-05-12: 1 g via INTRAVENOUS
  Filled 2015-05-12: qty 10

## 2015-05-12 MED ORDER — ONDANSETRON HCL 4 MG PO TABS
4.0000 mg | ORAL_TABLET | Freq: Four times a day (QID) | ORAL | Status: DC | PRN
Start: 2015-05-12 — End: 2015-05-17

## 2015-05-12 MED ORDER — SODIUM CHLORIDE 0.9 % IJ SOLN: 3.0000 mL | INTRAMUSCULAR | Status: DC | PRN

## 2015-05-12 MED ORDER — ALUM & MAG HYDROXIDE-SIMETH 200-200-20 MG/5ML PO SUSP: 30.0000 mL | Freq: Four times a day (QID) | ORAL | Status: DC | PRN

## 2015-05-12 MED ORDER — HEPARIN (PORCINE) IN NACL 100-0.45 UNIT/ML-% IJ SOLN
750.0000 [IU]/h | INTRAMUSCULAR | Status: DC
  Administered 2015-05-12: 750 [IU]/h via INTRAVENOUS
  Filled 2015-05-12 (×2): qty 250

## 2015-05-12 MED ORDER — SODIUM CHLORIDE 0.9 % IJ SOLN
3.0000 mL | Freq: Two times a day (BID) | INTRAMUSCULAR | Status: DC
  Administered 2015-05-14 – 2015-05-16 (×4): 3 mL via INTRAVENOUS

## 2015-05-12 MED ORDER — SODIUM CHLORIDE 0.9 % IJ SOLN
3.0000 mL | Freq: Two times a day (BID) | INTRAMUSCULAR | Status: DC
  Administered 2015-05-14 – 2015-05-16 (×5): 3 mL via INTRAVENOUS

## 2015-05-12 MED ORDER — AZITHROMYCIN 500 MG IV SOLR
500.0000 mg | INTRAVENOUS | Status: DC
  Administered 2015-05-12 – 2015-05-16 (×5): 500 mg via INTRAVENOUS
  Filled 2015-05-12 (×6): qty 500

## 2015-05-12 MED ORDER — DEXTROSE 5 % IV SOLN: 1.0000 g | INTRAVENOUS | Status: DC

## 2015-05-12 NOTE — H&P (Signed)
[facility] History and Physical  KURT AZIMI ZOX:[id number] DOB: [birth year] DOA: 05/18/2015  Referring physician:  PCP: [name], MD   Chief Complaint: Shortness of breath  HPI: Kim Flores is a 81 y.o. female with a past medical history of DVT, previously had been on Xarelto therapy, cognitive impairment, hypertension, hypothyroidism, presenting to the emergency room in hypoxemic respiratory failure. Patient's husband reporting that she has had a progressive decline over the past 2-3 weeks becoming progressively weaker, having recurrent falls, right greater assistance with activities of daily living. She has had difficulties just getting around her home. Husband reports that she had a fall on Monday injuring her left lower extremity. Today she had a fall coming out of the bathroom. Her husband described her legs simply giving out. There was no loss of consciousness. She was found to be profoundly hypoxemic in the emergency department having an O2 sat of 50% on room air. She had a CT scan of lungs with IV contrast in the emergency room where radiology reported a large pulmonary embolus occluding the right lower lobe pulmonary artery. During my evaluation she was hemodynamically stable having a last blood pressure 123/62 with a pulse of 99. I discussed case with Dr Tyson Alias of pulmonary critical care medicine who recommended anticoagulation with IV heparin.                                       Review of Systems:  I could not obtain a reliable review of systems given critical illness, respiratory failure, conative impairment  Past Medical History  Diagnosis Date  . Arthritis   . Thyroid disease    Past Surgical History  Procedure Laterality Date  . Appendectomy    . Abdominal hysterectomy    . Breast surgery    . Fracture surgery     Social History:  reports that she has been smoking Cigarettes.  She has a 55 pack-year smoking history. She has never used  smokeless tobacco. She reports that she does not drink alcohol or use illicit drugs.  No Known Allergies  History reviewed. No pertinent family history. could not obtain reliable family history  Prior to Admission medications   Medication Sig Start Date End Date Taking? Authorizing Provider  amLODipine (NORVASC) 5 MG tablet Take 5 mg by mouth daily.   Yes Historical Provider, MD  levothyroxine (SYNTHROID, LEVOTHROID) 100 MCG tablet TAKE 1 TABLET EVERY MORNING ON AN EMPTY STOMACH ONCE A DAY 05/05/15  Yes Historical Provider, MD  Multiple Vitamins-Minerals (CENTRUM SILVER ADULT 50+) TABS Take 1 tablet by mouth daily.   Yes Historical Provider, MD  indomethacin (INDOCIN) 25 MG capsule Take 1 capsule (25 mg total) by mouth 3 (three) times daily with meals. Patient not taking: Reported on 08/31/2014 03/14/14   Carmelina Dane, MD  XARELTO STARTER PACK 15 & 20 MG TBPK Take 15-20 mg by mouth as directed. Take as directed on package: Start with one 15mg  tablet by mouth twice a day with food. On Day 22, switch to one 20mg  tablet once a day with food. 08/31/14   Gerhard Munch, MD   Physical Exam: Filed Vitals:   05/15/2015 1801 05/09/2015 1804 05/28/2015 1808 05/11/2015 1812  BP: 123/62     Pulse: 99     Temp:      TempSrc:      Resp: 22     Height:   (1.575 m)  Weight:    47.174 kg (104 lb)  SpO2: 79% 87% 94%     Wt Readings from Last 3 Encounters:  05/28/2015 47.174 kg (104 lb)  03/14/14 43.999 kg (97 lb)    General:  Ill-appearing female, having mental status changes, she is arousable Eyes: PERRL, normal lids, irises & conjunctiva ENT: grossly normal hearing, lips & tongue, dry oral mucosa Neck: no LAD, masses or thyromegaly Cardiovascular: RRR, no m/r/g 2+ bilateral extremity pitting edema Telemetry: SR, no arrhythmias  Respiratory: She has by basilar crackles, coarse respiratory sounds, positive rales Abdomen: soft, ntnd Skin: no rash or induration seen on limited  exam Musculoskeletal: grossly normal tone BUE/BLE, 2+ bilateral extremity pitting edema Psychiatric: grossly normal mood and affect, speech fluent and appropriate Neurologic: grossly non-focal.          Labs on Admission:  Basic Metabolic Panel:  Recent Labs Lab 05/24/2015 1534  NA 143  K 3.9  CL 101  CO2 35*  GLUCOSE 104*  BUN 15  CREATININE 0.56  CALCIUM 9.1   Liver Function Tests:  Recent Labs Lab 05/11/2015 1534  AST 36  ALT 32  ALKPHOS 70  BILITOT 0.5  PROT 7.3  ALBUMIN 3.5   No results for input(s): LIPASE, AMYLASE in the last 168 hours. No results for input(s): AMMONIA in the last 168 hours. CBC:  Recent Labs Lab 05/23/2015 1534  WBC 8.6  NEUTROABS 6.3  HGB 13.7  HCT 46.5*  MCV 91.7  PLT 390   Cardiac Enzymes:  Recent Labs Lab 04/29/2015 1534  TROPONINI 0.03    BNP (last 3 results)  Recent Labs  05/19/2015 1534  BNP 655.6*    ProBNP (last 3 results) No results for input(s): PROBNP in the last 8760 hours.  CBG:  Recent Labs Lab 05/19/2015 1526  GLUCAP 102*    Radiological Exams on Admission: Dg Chest 2 View  05/25/2015   CLINICAL DATA:  Severe shortness of breath today.  EXAM: CHEST  2 VIEW  COMPARISON:  August 31, 2014  FINDINGS: The heart size and mediastinal contours are stable. The aorta is tortuous. Heart size is enlarged. There is pulmonary edema. There are small bilateral pleural effusions. Patchy consolidation of bilateral lung bases are noted. The visualized skeletal structures are stable.  IMPRESSION: Pulmonary edema.  Bilateral pleural effusions. Consolidation of bilateral lung bases at least in part due to atelectasis but superimposed pneumonia is not excluded.   Electronically Signed   By: Sherian Rein M.D.   On: 05/20/2015 16:13   Ct Head Wo Contrast  04/29/2015   CLINICAL DATA:  Fall, hit head on desk.  EXAM: CT HEAD WITHOUT CONTRAST  TECHNIQUE: Contiguous axial images were obtained from the base of the skull through the vertex  without intravenous contrast.  COMPARISON:  None.  FINDINGS: There is atrophy and chronic small vessel disease changes. No acute intracranial abnormality. Specifically, no hemorrhage, hydrocephalus, mass lesion, acute infarction, or significant intracranial injury. No acute calvarial abnormality. Visualized paranasal sinuses and mastoids clear. Orbital soft tissues unremarkable.  IMPRESSION: No acute intracranial abnormality.  Atrophy, chronic microvascular disease.   Electronically Signed   By: Charlett Nose M.D.   On: 05/08/2015 16:02   Ct Angio Chest Pe W/cm &/or Wo Cm  05/07/2015   CLINICAL DATA:  Hypoxemia.  Bilateral pleural effusions.  EXAM: CT ANGIOGRAPHY CHEST WITH CONTRAST  TECHNIQUE: Multidetector CT imaging of the chest was performed using the standard protocol during bolus administration of  intravenous contrast. Multiplanar CT image reconstructions and MIPs were obtained to evaluate the vascular anatomy.  CONTRAST:  OMNIPAQUE IOHEXOL 350 MG/ML SOLN  COMPARISON:  Chest x-rays dated 06/08/15 and 08/31/2014  FINDINGS: There is a large pulmonary embolus occluding the right lower lobe pulmonary artery. The embolus also extends into a posterior branch of the right upper lobe pulmonary artery although this is not occlusive.  The patient also has a 19 x 14 x 11 mm thrombus on the medial wall of the ascending thoracic aorta 5 cm above the aortic valve.  There is extensive coronary artery calcification. RV LV ratio is normal.  There is a 6 mm smoothly marginated nodule in the left upper lobe just above the lingula. No other pulmonary nodules.  There are moderate bilateral pleural effusions with slight compressive atelectasis in the left lower lobe. Extensive aortic atherosclerosis. No acute osseous abnormalities. The visualized portion of the upper abdomen is normal.  Review of the MIP images confirms the above findings.  IMPRESSION: 1. Large pulmonary embolus occluding the right lower lobe pulmonary  arteries. The embolus also extends into the posterior branch of the right upper lobe pulmonary artery. 2. Large thrombus on the medial wall of the ascending thoracic aorta. 3. 6 mm nodule in the left upper lobe. If the patient is at high risk for bronchogenic carcinoma, follow-up chest CT at 6-12 months is recommended. If the patient is at low risk for bronchogenic carcinoma, follow-up chest CT at 12 months is recommended. This recommendation follows the consensus statement: Guidelines for Management of Small Pulmonary Nodules Detected on CT Scans: A Statement from the Fleischner Society as published in Radiology 2005;237:395-400. 4. Moderate bilateral pleural effusions, left greater than right.   Electronically Signed   By: Francene Boyers M.D.   On: 06-08-2015 18:11    EKG: Independently reviewed.   Assessment/Plan Principal Problem:   Pulmonary embolism Active Problems:   Respiratory failure   Hypothyroidism   1. Acute hypoxemic respiratory failure. Evidence by an O2 sat of 50% on room air on presentation, placed on a nonrebreather, likely secondary to pulmonary embolism with significant acquired pneumonia contributing. Will initiate IV heparin, consult pharmacy for dosing. Will also treat her empirically for community acquired pneumonia with ceftriaxone and azithromycin. Will place patient in the step down unit for close monitoring. Case discussed with pulmonary medicine who felt that respiratory failure was likely multifactorial with continued acquired pneumonia and PE contributing. 2. Pulmonary embolism. Patient had a history of DVT diagnosed in December, previously on Xarelto. Neither patient or husband could tell me when she discontinued Xarelto. Radiology reporting that CT scan of lungs with IV contrast showing large pulmonary embolism. I discussed case with pulmonary medicine. Patient not felt to be candidate for thrombolysis. She was placed on IV heparin in the emergency  department. 3. Hypothyroidism. Will continue Synthroid 100 g by mouth daily check a TSH 4. Functional decline/failure to thrive. Family members reporting patient having a functional decline over the past several weeks, having greater functional dependency, with recurrent falls. I suspect this is multifactorial with pulmonary embolism, may require pneumonia contributed. Will consult physical therapy. 5. Hypertension. Will hold antihypertensives agents given large pulmonary embolism seen on CT scan as I'm concerned with precipitating hypotension. 6. DVT prophylaxis. Patient fully anticoagulated   Code Status: I discussed CODE STATUS with family members at bedside, she is a DO NOT RESUSCITATE Family Communication: I spoke with her son and husband were present at bedside Disposition Plan: Will  admit patient to the step down unit  Time spent: 70 minutes  Jeralyn Bennett [facility] Pager [phone]

## 2015-05-12 NOTE — ED Notes (Signed)
Per EMS pt fell Monday, hit her head on the deck. Healing bruise to left eye, fall unwitnessed, unknown what she hit her head on. Husband states he couldn't get her to respond for a couple minutes after the fall. Was not medically examined. Today, she was coming out of the bathroom, husband was walking behind her and her legs just gave out. Did not lose consciousness, husband was able to catch her and lower her to the ground safely. Pt also complaining of blurred vision that has begun sometime since the first fall. No complaints of pain, per EMS no confusion/slurred speech observed.

## 2015-05-12 NOTE — Progress Notes (Signed)
CSW attempted to meet with patient. However, PA was present.  CSW will attempt to check back in with patient.  Trish Mage [phone] ED CSW 06/10/15 4:45 PM

## 2015-05-12 NOTE — Progress Notes (Signed)
eLink Physician-Brief Progress Note Patient Name: Kim Flores DOB: 09/01/1929 MRN: [id number]   Date of Service  2015/05/30  HPI/Events of Note  Called to assess pt for PE options treatment NO distress, HR wnl, BP wnl, lactic 1.1, trop essentially neg CT in my opinion over read? The clot is distal left main Pa but burden reduced fast and rest is distal, ratio 0.8  eICU Interventions  Not a candidate EKOS, cathter unlikely to even get to this location effectivly, age is risk ICH Risk/ benefit ratio DOES NOT favor ekos Heparin is recommended  PCCM to see D/w Hospitalist, call if changes in status     Intervention Category Major Interventions: Respiratory failure - evaluation and management  FEINSTEIN,DANIEL J. May 30, 2015, 7:08 PM

## 2015-05-12 NOTE — Progress Notes (Signed)
ANTICOAGULATION CONSULT NOTE - Initial Consult  Pharmacy Consult for Heparin Indication: pulmonary embolus  No Known Allergies  Patient Measurements: Height:  (157.5 cm) Weight: 104 lb (47.174 kg) IBW/kg (Calculated) : 50.1  Vital Signs: Temp: 98 F (36.7 C) (09/14 1459) Temp Source: Oral (09/14 1459) BP: 123/62 mmHg (09/14 1801) Pulse Rate: 99 (09/14 1801)  Labs:  Recent Labs  06/06/2015 1534  HGB 13.7  HCT 46.5*  PLT 390  CREATININE 0.56  TROPONINI 0.03    Estimated Creatinine Clearance: 39.7 mL/min (by C-G formula based on Cr of 0.56).   Medical History: Past Medical History  Diagnosis Date  . Arthritis   . Thyroid disease     Medications:  Scheduled:  Infusions:  PRN:   Assessment: 81 year old female presents to the ED with weakness and shortness of breath.  Of note, she fell Monday and hit her head but was not medically examined. Today in ED, head CT did not show any abnormalities.  Chest CTa did show a large PE.  Pharmacy is consulted to dose IV heparin.  Patient is noted to have a history of DVT in January of 2016 for which she was prescribed Xarelto.  She was instructed by MD to stop taking this approximately 2 months ago when she stopped taking estrogen.  Goal of Therapy:  Heparin level 0.3-0.7 units/ml Monitor platelets by anticoagulation protocol: Yes   Plan:   Heparin 1400 units IV bolus  Heparin IV infusion 750 units/hr  Check heparin level in 8 hrs  Daily heparin level, CBC  Loralee Pacas, PharmD, BCPS Pager: [phone] 06-Jun-2015,6:22 PM

## 2015-05-12 NOTE — ED Provider Notes (Signed)
CSN: [id number]     Arrival date & time 05/28/2015  1440 History   First MD Initiated Contact with Patient 05/10/2015 1506     Chief Complaint  Patient presents with  . Shortness of Breath  . Fall  . Head Injury  . Weakness     (Consider location/radiation/quality/duration/timing/severity/associated sxs/prior Treatment) Patient is a 81 y.o. female presenting with shortness of breath. The history is provided by the spouse and the patient.  Shortness of Breath Severity:  Moderate Onset quality:  Gradual Duration:  2 days Timing:  Constant Progression:  Worsening Chronicity:  New Context comment:  Recent fall, weakness, confusion intermittently Relieved by:  Nothing Worsened by:  Nothing tried Ineffective treatments:  None tried Associated symptoms: cough   Associated symptoms: no chest pain, no fever and no vomiting   Risk factors: hx of PE/DVT     Past Medical History  Diagnosis Date  . Arthritis   . Thyroid disease    Past Surgical History  Procedure Laterality Date  . Appendectomy    . Abdominal hysterectomy    . Breast surgery     History reviewed. No pertinent family history. Social History  Substance Use Topics  . Smoking status: Current Every Day Smoker  . Smokeless tobacco: Never Used  . Alcohol Use: No   OB History    No data available     Review of Systems  Constitutional: Negative for fever.  Respiratory: Positive for cough and shortness of breath.   Cardiovascular: Negative for chest pain.  Gastrointestinal: Negative for vomiting.  All other systems reviewed and are negative.     Allergies  Review of patient's allergies indicates no known allergies.  Home Medications   Prior to Admission medications   Medication Sig Start Date End Date Taking? Authorizing Provider  amLODipine (NORVASC) 5 MG tablet Take 5 mg by mouth daily.   Yes Historical Provider, MD  levothyroxine (SYNTHROID, LEVOTHROID) 100 MCG tablet TAKE 1 TABLET EVERY MORNING ON AN  EMPTY STOMACH ONCE A DAY 05/05/15  Yes Historical Provider, MD  Calcium-Vitamin D (CALTRATE 600 PLUS-VIT D PO) Take 1 tablet by mouth 2 (two) times daily.    Historical Provider, MD  colchicine 0.6 MG tablet 2 now and 1 in one hour.  Tomorrow 1 po bid 03/14/14   Carmelina Dane, MD  estropipate (OGEN) 1.5 MG tablet Take 1.5 mg by mouth daily.    Historical Provider, MD  indomethacin (INDOCIN) 25 MG capsule Take 1 capsule (25 mg total) by mouth 3 (three) times daily with meals. Patient not taking: Reported on 08/31/2014 03/14/14   Carmelina Dane, MD  Multiple Vitamins-Minerals (CENTRUM SILVER ADULT 50+) TABS Take 1 tablet by mouth daily.    Historical Provider, MD  XARELTO STARTER PACK 15 & 20 MG TBPK Take 15-20 mg by mouth as directed. Take as directed on package: Start with one 15mg  tablet by mouth twice a day with food. On Day 22, switch to one 20mg  tablet once a day with food. 08/31/14   Gerhard Munch, MD   BP 114/65 mmHg  Pulse 103  Temp(Src) 98 F (36.7 C) (Oral)  Resp 30  SpO2 96% Physical Exam  Constitutional: She is oriented to person, place, and time. She appears listless. She appears ill.  HENT:  Head: Normocephalic. Head is with contusion (left forehead).  Eyes: Conjunctivae are normal.  Neck: Neck supple. No tracheal deviation present.  Cardiovascular: Regular rhythm and normal heart sounds.  Tachycardia present.   Pulmonary/Chest:  She is in respiratory distress (shallow respirations). She has decreased breath sounds (bilateral bases). She has rales (left side).  Abdominal: Soft. She exhibits no distension. There is no tenderness.  Neurological: She is oriented to person, place, and time. She appears listless. GCS eye subscore is 4. GCS verbal subscore is 5. GCS motor subscore is 6.  Skin: Skin is warm and dry.  Psychiatric: Her affect is blunt. She is slowed.    ED Course  Procedures (including critical care time)  CRITICAL CARE Performed by: Lyndal Pulley Total  critical care time: 30 min Critical care time was exclusive of separately billable procedures and treating other patients. Critical care was necessary to treat or prevent imminent or life-threatening deterioration. Critical care was time spent personally by me on the following activities: development of treatment plan with patient and/or surrogate as well as nursing, discussions with consultants, evaluation of patient's response to treatment, examination of patient, obtaining history from patient or surrogate, ordering and performing treatments and interventions, ordering and review of laboratory studies, ordering and review of radiographic studies, pulse oximetry and re-evaluation of patient's condition.  Emergency Focused Ultrasound Exam Limited Ultrasound of the Heart and Pericardium  Performed and interpreted by Dr. Clydene Pugh Indication: shortness of breath Multiple views of the heart, pericardium, and IVC are obtained with a multi frequency probe.  Findings: nml contractility, no anechoic fluid, no IVC collapse, large RV, no septum bowing Interpretation: nml ejection fraction, no pericardial effusion, elevated CVP, no clear evidence of right heart strain Images archived electronically.  CPT Code: 16109   Labs Review Labs Reviewed  COMPREHENSIVE METABOLIC PANEL - Abnormal; Notable for the following:    CO2 35 (*)    Glucose, Bld 104 (*)    All other components within normal limits  CBC WITH DIFFERENTIAL/PLATELET - Abnormal; Notable for the following:    HCT 46.5 (*)    MCHC 29.5 (*)    RDW 16.9 (*)    Monocytes Absolute 1.1 (*)    All other components within normal limits  URINALYSIS, ROUTINE W REFLEX MICROSCOPIC (NOT AT [facility]) - Abnormal; Notable for the following:    APPearance CLOUDY (*)    Protein, ur 30 (*)    Nitrite POSITIVE (*)    Leukocytes, UA SMALL (*)    All other components within normal limits  BLOOD GAS, ARTERIAL - Abnormal; Notable for the following:    pH,  Arterial 7.313 (*)    pCO2 arterial 67.6 (*)    pO2, Arterial 155 (*)    Bicarbonate 33.3 (*)    Acid-Base Excess 5.3 (*)    All other components within normal limits  BRAIN NATRIURETIC PEPTIDE - Abnormal; Notable for the following:    B Natriuretic Peptide 655.6 (*)    All other components within normal limits  URINE MICROSCOPIC-ADD ON - Abnormal; Notable for the following:    Squamous Epithelial / LPF FEW (*)    Bacteria, UA FEW (*)    Casts HYALINE CASTS (*)    All other components within normal limits  CBG MONITORING, ED - Abnormal; Notable for the following:    Glucose-Capillary 102 (*)    All other components within normal limits  CULTURE, BLOOD (ROUTINE X 2)  CULTURE, BLOOD (ROUTINE X 2)  URINE CULTURE  TROPONIN I  APTT  PROTIME-INR  I-STAT CG4 LACTIC ACID, ED    Imaging Review Dg Chest 2 View  05/15/2015   CLINICAL DATA:  Severe shortness of breath today.  EXAM: CHEST  2 VIEW  COMPARISON:  August 31, 2014  FINDINGS: The heart size and mediastinal contours are stable. The aorta is tortuous. Heart size is enlarged. There is pulmonary edema. There are small bilateral pleural effusions. Patchy consolidation of bilateral lung bases are noted. The visualized skeletal structures are stable.  IMPRESSION: Pulmonary edema.  Bilateral pleural effusions. Consolidation of bilateral lung bases at least in part due to atelectasis but superimposed pneumonia is not excluded.   Electronically Signed   By: Sherian Rein M.D.   On: 05/25/2015 16:13   Ct Head Wo Contrast  05/10/2015   CLINICAL DATA:  Fall, hit head on desk.  EXAM: CT HEAD WITHOUT CONTRAST  TECHNIQUE: Contiguous axial images were obtained from the base of the skull through the vertex without intravenous contrast.  COMPARISON:  None.  FINDINGS: There is atrophy and chronic small vessel disease changes. No acute intracranial abnormality. Specifically, no hemorrhage, hydrocephalus, mass lesion, acute infarction, or significant  intracranial injury. No acute calvarial abnormality. Visualized paranasal sinuses and mastoids clear. Orbital soft tissues unremarkable.  IMPRESSION: No acute intracranial abnormality.  Atrophy, chronic microvascular disease.   Electronically Signed   By: Charlett Nose M.D.   On: 05/21/2015 16:02   Ct Angio Chest Pe W/cm &/or Wo Cm  05/21/2015   CLINICAL DATA:  Hypoxemia.  Bilateral pleural effusions.  EXAM: CT ANGIOGRAPHY CHEST WITH CONTRAST  TECHNIQUE: Multidetector CT imaging of the chest was performed using the standard protocol during bolus administration of intravenous contrast. Multiplanar CT image reconstructions and MIPs were obtained to evaluate the vascular anatomy.  CONTRAST:  OMNIPAQUE IOHEXOL 350 MG/ML SOLN  COMPARISON:  Chest x-rays dated 05/05/2015 and 08/31/2014  FINDINGS: There is a large pulmonary embolus occluding the right lower lobe pulmonary artery. The embolus also extends into a posterior branch of the right upper lobe pulmonary artery although this is not occlusive.  The patient also has a 19 x 14 x 11 mm thrombus on the medial wall of the ascending thoracic aorta 5 cm above the aortic valve.  There is extensive coronary artery calcification. RV LV ratio is normal.  There is a 6 mm smoothly marginated nodule in the left upper lobe just above the lingula. No other pulmonary nodules.  There are moderate bilateral pleural effusions with slight compressive atelectasis in the left lower lobe. Extensive aortic atherosclerosis. No acute osseous abnormalities. The visualized portion of the upper abdomen is normal.  Review of the MIP images confirms the above findings.  IMPRESSION: 1. Large pulmonary embolus occluding the right lower lobe pulmonary arteries. The embolus also extends into the posterior branch of the right upper lobe pulmonary artery. 2. Large thrombus on the medial wall of the ascending thoracic aorta. 3. 6 mm nodule in the left upper lobe. If the patient is at high risk for  bronchogenic carcinoma, follow-up chest CT at 6-12 months is recommended. If the patient is at low risk for bronchogenic carcinoma, follow-up chest CT at 12 months is recommended. This recommendation follows the consensus statement: Guidelines for Management of Small Pulmonary Nodules Detected on CT Scans: A Statement from the Fleischner Society as published in Radiology 2005;237:395-400. 4. Moderate bilateral pleural effusions, left greater than right.   Electronically Signed   By: Francene Boyers M.D.   On: 05/16/2015 18:11   I have personally reviewed and evaluated these images and lab results as part of my medical decision-making.   EKG Interpretation   Date/Time:  Wednesday May 12 2015 15:07:19 EDT Ventricular  Rate:  101 PR Interval:  136 QRS Duration: 80 QT Interval:  330 QTC Calculation: 428 R Axis:   15 Text Interpretation:  Sinus tachycardia Atrial premature complex LAE,  consider biatrial enlargement Low voltage, precordial leads RSR' in V1 or  V2, right VCD or RVH Confirmed by Tawonna Esquer MD, Reuel Boom ([id number]) on 05/16/2015  3:16:51 PM      MDM   Final diagnoses:  Hypoxemia  Pulmonary embolism  Bilateral pleural effusion  Respiratory failure with hypoxia and hypercapnia  UTI (lower urinary tract infection)  Fall, initial encounter  Head contusion, initial encounter    81 y.o. female presents with increasing respiratory difficulty after having a fall at home 2 days ago with ongoing progressive weakness prior to this. Has bruising over left forehead and slightly confused/slowed. Head CT negative, no other signs of acute injury related to fall. Has h/o DVT and recently stopped xarelto therapy. Severely hypoxemic on arrival, ill appearing and shallow breathing. Has bilaterally swollen tender legs. Tachycardic with multiple risk factors for PE and this is highly suspected. CT with PE and bilateral effusions. Will require stepdown admission as Pt is DNR/DNI after discussion with Pt  and husband. No hypotension or hemodynamic instability, no indication for lytic therapy currently. Covered with rocephin as urine appears c/w possible infection and may be contributing. Hospitalist was consulted for admission and will see the patient in the emergency department.     Lyndal Pulley, MD 05/13/15 0157

## 2015-05-13 ENCOUNTER — Inpatient Hospital Stay (HOSPITAL_COMMUNITY): Payer: Medicare Other

## 2015-05-13 DIAGNOSIS — I2699 Other pulmonary embolism without acute cor pulmonale: Principal | ICD-10-CM

## 2015-05-13 DIAGNOSIS — J9601 Acute respiratory failure with hypoxia: Secondary | ICD-10-CM

## 2015-05-13 DIAGNOSIS — R0902 Hypoxemia: Secondary | ICD-10-CM | POA: Insufficient documentation

## 2015-05-13 LAB — COMPREHENSIVE METABOLIC PANEL
ALK PHOS: 68 U/L (ref 38–126)
ALT: 34 U/L (ref 14–54)
ANION GAP: 7 (ref 5–15)
AST: 32 U/L (ref 15–41)
Albumin: 3.3 g/dL — ABNORMAL LOW (ref 3.5–5.0)
BILIRUBIN TOTAL: 0.4 mg/dL (ref 0.3–1.2)
BUN: 13 mg/dL (ref 6–20)
CALCIUM: 8.9 mg/dL (ref 8.9–10.3)
CO2: 37 mmol/L — AB (ref 22–32)
CREATININE: 0.5 mg/dL (ref 0.44–1.00)
Chloride: 102 mmol/L (ref 101–111)
Glucose, Bld: 97 mg/dL (ref 65–99)
Potassium: 3.9 mmol/L (ref 3.5–5.1)
Sodium: 146 mmol/L — ABNORMAL HIGH (ref 135–145)
TOTAL PROTEIN: 6.6 g/dL (ref 6.5–8.1)

## 2015-05-13 LAB — HEPARIN LEVEL (UNFRACTIONATED)
HEPARIN UNFRACTIONATED: 0.36 [IU]/mL (ref 0.30–0.70)
HEPARIN UNFRACTIONATED: 0.19 [IU]/mL — AB (ref 0.30–0.70)
Heparin Unfractionated: 0.26 IU/mL — ABNORMAL LOW (ref 0.30–0.70)

## 2015-05-13 LAB — CBC
HCT: 45 % (ref 36.0–46.0)
HEMOGLOBIN: 13.6 g/dL (ref 12.0–15.0)
MCH: 27.3 pg (ref 26.0–34.0)
MCHC: 30.2 g/dL (ref 30.0–36.0)
MCV: 90.2 fL (ref 78.0–100.0)
PLATELETS: 336 10*3/uL (ref 150–400)
RBC: 4.99 MIL/uL (ref 3.87–5.11)
RDW: 16.7 % — ABNORMAL HIGH (ref 11.5–15.5)
WBC: 9.9 10*3/uL (ref 4.0–10.5)

## 2015-05-13 LAB — GLUCOSE, CAPILLARY: GLUCOSE-CAPILLARY: 80 mg/dL (ref 65–99)

## 2015-05-13 MED ORDER — NICOTINE 14 MG/24HR TD PT24
14.0000 mg | MEDICATED_PATCH | Freq: Every day | TRANSDERMAL | Status: DC
  Administered 2015-05-14 – 2015-05-16 (×3): 14 mg via TRANSDERMAL
  Filled 2015-05-13 (×3): qty 1

## 2015-05-13 MED ORDER — ALBUTEROL SULFATE (2.5 MG/3ML) 0.083% IN NEBU
2.5000 mg | INHALATION_SOLUTION | RESPIRATORY_TRACT | Status: DC | PRN
  Administered 2015-05-13: 2.5 mg via RESPIRATORY_TRACT
  Filled 2015-05-13: qty 3

## 2015-05-13 MED ORDER — HEPARIN (PORCINE) IN NACL 100-0.45 UNIT/ML-% IJ SOLN
800.0000 [IU]/h | INTRAMUSCULAR | Status: DC
  Filled 2015-05-13: qty 250

## 2015-05-13 MED ORDER — HEPARIN (PORCINE) IN NACL 100-0.45 UNIT/ML-% IJ SOLN
950.0000 [IU]/h | INTRAMUSCULAR | Status: DC
  Administered 2015-05-14 – 2015-05-16 (×3): 950 [IU]/h via INTRAVENOUS
  Filled 2015-05-13 (×6): qty 250

## 2015-05-13 MED ORDER — DEXTROSE IN LACTATED RINGERS 5 % IV SOLN
INTRAVENOUS | Status: DC
  Administered 2015-05-13: 45 mL/h via INTRAVENOUS

## 2015-05-13 MED ORDER — PIPERACILLIN-TAZOBACTAM 3.375 G IVPB
3.3750 g | Freq: Three times a day (TID) | INTRAVENOUS | Status: DC
  Administered 2015-05-13 – 2015-05-17 (×10): 3.375 g via INTRAVENOUS
  Filled 2015-05-13 (×11): qty 50

## 2015-05-13 NOTE — Consult Note (Signed)
Name: Kim Flores MRN: [id number] DOB: 10-27-1929    ADMISSION DATE:  2015/06/11 CONSULTATION DATE:  9/15  REFERRING MD :  Regalado   CHIEF COMPLAINT:  PE   BRIEF PATIENT DESCRIPTION:  81 year old female w/ progressive dementia and FTT, admitted 9/14 w/ RLL PE, bilateral PNA,  pleural effusions and hypoxic acute respiratory failure. PCCM asked to assist w/ recommended therapy re: the PE given her O2 requirements.   SIGNIFICANT EVENTS    STUDIES:  CT Chest 9/14: 1. Large pulmonary embolus occluding the right lower lobe pulmonary arteries. The embolus also extends into the posterior branch of the right upper lobe pulmonary artery. 2. Large thrombus on the medial wall of the ascending thoracic aorta. 3. 6 mm nodule in the left upper lobe.   HISTORY OF PRESENT ILLNESS:   This is a 81 year old female w/ h/o failure to thrive as evidenced by frequent falls, progressive short term memory loss, anorexia, and weight loss. Resides at home w/ her husband of 30 years. Has sig h/o DVT in past and had been on Xarelto for this. Husband reports over last 3 months memory has been worse and she has been largely immobile. Over the last 3 weeks her strength and appetite had greatly reduced. About 1 week ago her cough began to worsen and strength was to the point her legs would just "give out". She presented to the ER on 9/14 for increased work of breathing and shortness of breath. Found to be extremely hypoxic w/ Room air sats in 50s. CT imaging was obtained demonstrating RLL Pulmonary emboli, bilateral PNA and pleural effusions. PCCM was asked to see Re: recommended treatment. RV/LV ratio was normal, CEs negative, but was on high FIO2.   PAST MEDICAL HISTORY :   has a past medical history of Arthritis and Thyroid disease.  has past surgical history that includes Appendectomy; Abdominal hysterectomy; Breast surgery; and Fracture surgery. Prior to Admission medications   Medication Sig Start Date End  Date Taking? Authorizing Provider  amLODipine (NORVASC) 5 MG tablet Take 5 mg by mouth daily.   Yes Historical Provider, MD  levothyroxine (SYNTHROID, LEVOTHROID) 100 MCG tablet TAKE 1 TABLET EVERY MORNING ON AN EMPTY STOMACH ONCE A DAY 05/05/15  Yes Historical Provider, MD  Multiple Vitamins-Minerals (CENTRUM SILVER ADULT 50+) TABS Take 1 tablet by mouth daily.   Yes Historical Provider, MD  indomethacin (INDOCIN) 25 MG capsule Take 1 capsule (25 mg total) by mouth 3 (three) times daily with meals. Patient not taking: Reported on 08/31/2014 03/14/14   Carmelina Dane, MD  XARELTO STARTER PACK 15 & 20 MG TBPK Take 15-20 mg by mouth as directed. Take as directed on package: Start with one 15mg  tablet by mouth twice a day with food. On Day 22, switch to one 20mg  tablet once a day with food. 08/31/14   Gerhard Munch, MD   No Known Allergies  FAMILY HISTORY:  family history is not on file. SOCIAL HISTORY:  reports that she has been smoking Cigarettes.  She has a 55 pack-year smoking history. She has never used smokeless tobacco. She reports that she does not drink alcohol or use illicit drugs.  REVIEW OF SYSTEMS:   Unable   SUBJECTIVE:  Appears comfortable as long as not touched  VITAL SIGNS: Temp:  [97.6 F (36.4 C)-98 F (36.7 C)] 97.6 F (36.4 C) (09/15 0800) Pulse Rate:  [88-103] 88 (09/15 0400) Resp:  [13-30] 22 (09/15 0400) BP: (102-129)/(52-88) 107/52 mmHg (  09/15 0400) SpO2:  [50 %-100 %] 100 % (09/15 0400) Weight:  [47.174 kg (104 lb)-52.7 kg (116 lb 2.9 oz)] 52.7 kg (116 lb 2.9 oz) (09/14 2100) 100% NRB PHYSICAL EXAMINATION: General:  Acute on chronically ill appearing 81 year old appears terminally ill Neuro:  Moans, yells ouch when touched. Limited ROM due to pain. Not oriented.  HEENT:  MM dry, temporal wasting  Cardiovascular:  rrr Lungs:  Scattered rhonchi, decreased bases  Abdomen:  Soft, non-tender  Musculoskeletal:  Limited ROM d/t pain  Skin:  Left LE erythremic w/  large scabbed over lesion extending along most of the lateral aspect of the lower LLE. Bilateral LE edema, multiple areas of bruising   Recent Labs Lab Jun 01, 2015 1534 05/13/15 0340  NA 143 146*  K 3.9 3.9  CL 101 102  CO2 35* 37*  BUN 15 13  CREATININE 0.56 0.50  GLUCOSE 104* 97    Recent Labs Lab 2015/06/01 1534 05/13/15 0340  HGB 13.7 13.6  HCT 46.5* 45.0  WBC 8.6 9.9  PLT 390 336   Dg Chest 2 View  01-Jun-2015   CLINICAL DATA:  Severe shortness of breath today.  EXAM: CHEST  2 VIEW  COMPARISON:  August 31, 2014  FINDINGS: The heart size and mediastinal contours are stable. The aorta is tortuous. Heart size is enlarged. There is pulmonary edema. There are small bilateral pleural effusions. Patchy consolidation of bilateral lung bases are noted. The visualized skeletal structures are stable.  IMPRESSION: Pulmonary edema.  Bilateral pleural effusions. Consolidation of bilateral lung bases at least in part due to atelectasis but superimposed pneumonia is not excluded.   Electronically Signed   By: Sherian Rein M.D.   On: 2015/06/01 16:13   Ct Head Wo Contrast  06/01/15   CLINICAL DATA:  Fall, hit head on desk.  EXAM: CT HEAD WITHOUT CONTRAST  TECHNIQUE: Contiguous axial images were obtained from the base of the skull through the vertex without intravenous contrast.  COMPARISON:  None.  FINDINGS: There is atrophy and chronic small vessel disease changes. No acute intracranial abnormality. Specifically, no hemorrhage, hydrocephalus, mass lesion, acute infarction, or significant intracranial injury. No acute calvarial abnormality. Visualized paranasal sinuses and mastoids clear. Orbital soft tissues unremarkable.  IMPRESSION: No acute intracranial abnormality.  Atrophy, chronic microvascular disease.   Electronically Signed   By: Charlett Nose M.D.   On: 06/01/15 16:02   Ct Angio Chest Pe W/cm &/or Wo Cm  01-Jun-2015   CLINICAL DATA:  Hypoxemia.  Bilateral pleural effusions.  EXAM: CT  ANGIOGRAPHY CHEST WITH CONTRAST  TECHNIQUE: Multidetector CT imaging of the chest was performed using the standard protocol during bolus administration of intravenous contrast. Multiplanar CT image reconstructions and MIPs were obtained to evaluate the vascular anatomy.  CONTRAST:  OMNIPAQUE IOHEXOL 350 MG/ML SOLN  COMPARISON:  Chest x-rays dated 06/01/15 and 08/31/2014  FINDINGS: There is a large pulmonary embolus occluding the right lower lobe pulmonary artery. The embolus also extends into a posterior branch of the right upper lobe pulmonary artery although this is not occlusive.  The patient also has a 19 x 14 x 11 mm thrombus on the medial wall of the ascending thoracic aorta 5 cm above the aortic valve.  There is extensive coronary artery calcification. RV LV ratio is normal.  There is a 6 mm smoothly marginated nodule in the left upper lobe just above the lingula. No other pulmonary nodules.  There are moderate bilateral pleural effusions with slight compressive  atelectasis in the left lower lobe. Extensive aortic atherosclerosis. No acute osseous abnormalities. The visualized portion of the upper abdomen is normal.  Review of the MIP images confirms the above findings.  IMPRESSION: 1. Large pulmonary embolus occluding the right lower lobe pulmonary arteries. The embolus also extends into the posterior branch of the right upper lobe pulmonary artery. 2. Large thrombus on the medial wall of the ascending thoracic aorta. 3. 6 mm nodule in the left upper lobe. If the patient is at high risk for bronchogenic carcinoma, follow-up chest CT at 6-12 months is recommended. If the patient is at low risk for bronchogenic carcinoma, follow-up chest CT at 12 months is recommended. This recommendation follows the consensus statement: Guidelines for Management of Small Pulmonary Nodules Detected on CT Scans: A Statement from the Fleischner Society as published in Radiology 2005;237:395-400. 4. Moderate bilateral  pleural effusions, left greater than right.   Electronically Signed   By: Francene Boyers M.D.   On: 05-27-2015 18:11    ASSESSMENT / PLAN:  Acute hypoxic Respiratory failure in the setting of acute RLL Pulmonary emboli, PNA (favor aspiration given husbands report of coughing @ the table), and bilateral pleural effusions.  She appears terminally ill, and I am not sure that she will survive this. She does have a few dilemmas we will need to consider should she survive the next 24hrs. In typical situation she would need therapeutic and diagnostic thoracentesis given PNA and recent falls. I do not think she will tolerate this well, nor will it lead to any significant improvement symptom wise in her current condition. Also given her frequent falls would have to wonder if systemic anticoagulation makes sense. I have told the husband that I do not think she will survive this hospitalization. He tells me that he and her sons had discussed that and they felt that she may not survive as well. Have recommended hospice to him & should she survive the next 24 hours it might be that residential hospice or home hospice is the right option here.  Plan Cont IV heparin for now; normally would be life long anticoagulation. Not sure she is a safe anticoagulation candidate given fall risk. May need to consider filter and comfort.  Cont current ABX Wean FIO2 Palliative care consult Agree w/ Full DNR Would support full comfort if that's what the family would agree with  FTT in setting of dementia Plan See above   We have nothing to add here. Not sure she will survive. She is appropriately DNR. Situation is grim as she is a high fall risk making anticoagulation risky at home. Also suspect she is aspirating and has clear failure to thrive. IF she survives the next 24-48 hrs will need to decide on anticoagulation vs filter given fall risk. Will not offer thora. Would go ahead and get hospice involved. Will place referral.     05/13/2015, 9:54 AM

## 2015-05-13 NOTE — Progress Notes (Signed)
[facility] PROGRESS NOTE  Kim Flores ZOX:[id number] DOB: 1930/04/03 DOA: 05/22/2015 PCP: [name], MD  Assessment/Plan: 1-Acute hypoxemic respiratory failure: hypercapnic; In setting of PE and PNA;  Continue with IV antibiotics and Heparin Gtt CCM was consulted.   2-Pulmonary embolism:  Vitals stables, continue with heparin Gtt.  CCM consulted.   3-PNA community vs aspiration. :  Chest x ray with bilateral infiltrates.  Will continue with Azithromycin. Change ceftriaxone to zosyn.   4-Hypernatremia: resume diet. Encourage oral intake,. Repeat labs in am. Wont start fluids due to bilateral pleural effusion.   5-Hypothyroidism. continue Synthroid 100 g by mouth daily. TSH low at 0.34. Check free t 3 and T 4 and adjust synthroid as needed.   6-Functional decline/failure to thrive. Multifactorial, acute illness, PE, PNA.  Will need PT.   7-Hypertension; hold BP medications.   8-6 mm nodule in the left upper lobe; she will need follo up scan.  9-UTI; zosyn should cover.   Code Status: DNR Family Communication: Care discussed with husband  Disposition Plan: remain in the step down unit   Consultants:  CCM  Procedures: none  Antibiotics:  Ceftriaxone 9-14  Azithromycin 9-14  HPI/Subjective: Lethargic, barely open eyes.  Per nurse patient was awake all night.    Objective: Filed Vitals:   05/13/15 0400  BP: 107/52  Pulse: 88  Temp:   Resp: 22    Intake/Output Summary (Last 24 hours) at 05/13/15 0816 Last data filed at 05/13/15 0400  Gross per 24 hour  Intake 312.38 ml  Output     50 ml  Net 262.38 ml   Filed Weights   05/16/2015 1812 04/30/2015 2100  Weight: 47.174 kg (104 lb) 52.7 kg (116 lb 2.9 oz)    Exam:   General:  Lethargic, NB mask  Cardiovascular: S 1, S 2 RRR  Respiratory: Decreased breath sounds  Abdomen: BS present, soft, nt  Musculoskeletal: no edema  Data Reviewed: Basic Metabolic Panel:  Recent Labs Lab  05/24/2015 1534 05/13/15 0340  NA 143 146*  K 3.9 3.9  CL 101 102  CO2 35* 37*  GLUCOSE 104* 97  BUN 15 13  CREATININE 0.56 0.50  CALCIUM 9.1 8.9   Liver Function Tests:  Recent Labs Lab 05/16/2015 1534 05/13/15 0340  AST 36 32  ALT 32 34  ALKPHOS 70 68  BILITOT 0.5 0.4  PROT 7.3 6.6  ALBUMIN 3.5 3.3*   No results for input(s): LIPASE, AMYLASE in the last 168 hours. No results for input(s): AMMONIA in the last 168 hours. CBC:  Recent Labs Lab 05/07/2015 1534 05/13/15 0340  WBC 8.6 9.9  NEUTROABS 6.3  --   HGB 13.7 13.6  HCT 46.5* 45.0  MCV 91.7 90.2  PLT 390 336   Cardiac Enzymes:  Recent Labs Lab 05/02/2015 1534  TROPONINI 0.03   BNP (last 3 results)  Recent Labs  05/10/2015 1534  BNP 655.6*    ProBNP (last 3 results) No results for input(s): PROBNP in the last 8760 hours.  CBG:  Recent Labs Lab 05/07/2015 1526  GLUCAP 102*    Recent Results (from the past 240 hour(s))  Urine culture     Status: None (Preliminary result)   Collection Time: 05/06/2015  4:54 PM  Result Value Ref Range Status   Specimen Description URINE, CLEAN CATCH  Final   Special Requests NONE  Final   Culture   Final    TOO YOUNG TO READ Performed at [facility]  Report Status PENDING  Incomplete  MRSA PCR Screening     Status: None   Collection Time: Jun 04, 2015  8:26 PM  Result Value Ref Range Status   MRSA by PCR NEGATIVE NEGATIVE Final    Comment:        The GeneXpert MRSA Assay (FDA approved for NASAL specimens only), is one component of a comprehensive MRSA colonization surveillance program. It is not intended to diagnose MRSA infection nor to guide or monitor treatment for MRSA infections.      Studies: Dg Chest 2 View  06-04-2015   CLINICAL DATA:  Severe shortness of breath today.  EXAM: CHEST  2 VIEW  COMPARISON:  August 31, 2014  FINDINGS: The heart size and mediastinal contours are stable. The aorta is tortuous. Heart size is enlarged. There is  pulmonary edema. There are small bilateral pleural effusions. Patchy consolidation of bilateral lung bases are noted. The visualized skeletal structures are stable.  IMPRESSION: Pulmonary edema.  Bilateral pleural effusions. Consolidation of bilateral lung bases at least in part due to atelectasis but superimposed pneumonia is not excluded.   Electronically Signed   By: Sherian Rein M.D.   On: Jun 04, 2015 16:13   Ct Head Wo Contrast  04-Jun-2015   CLINICAL DATA:  Fall, hit head on desk.  EXAM: CT HEAD WITHOUT CONTRAST  TECHNIQUE: Contiguous axial images were obtained from the base of the skull through the vertex without intravenous contrast.  COMPARISON:  None.  FINDINGS: There is atrophy and chronic small vessel disease changes. No acute intracranial abnormality. Specifically, no hemorrhage, hydrocephalus, mass lesion, acute infarction, or significant intracranial injury. No acute calvarial abnormality. Visualized paranasal sinuses and mastoids clear. Orbital soft tissues unremarkable.  IMPRESSION: No acute intracranial abnormality.  Atrophy, chronic microvascular disease.   Electronically Signed   By: Charlett Nose M.D.   On: 06/04/15 16:02   Ct Angio Chest Pe W/cm &/or Wo Cm  06-04-2015   CLINICAL DATA:  Hypoxemia.  Bilateral pleural effusions.  EXAM: CT ANGIOGRAPHY CHEST WITH CONTRAST  TECHNIQUE: Multidetector CT imaging of the chest was performed using the standard protocol during bolus administration of intravenous contrast. Multiplanar CT image reconstructions and MIPs were obtained to evaluate the vascular anatomy.  CONTRAST:  OMNIPAQUE IOHEXOL 350 MG/ML SOLN  COMPARISON:  Chest x-rays dated 2015-06-04 and 08/31/2014  FINDINGS: There is a large pulmonary embolus occluding the right lower lobe pulmonary artery. The embolus also extends into a posterior branch of the right upper lobe pulmonary artery although this is not occlusive.  The patient also has a 19 x 14 x 11 mm thrombus on the medial wall  of the ascending thoracic aorta 5 cm above the aortic valve.  There is extensive coronary artery calcification. RV LV ratio is normal.  There is a 6 mm smoothly marginated nodule in the left upper lobe just above the lingula. No other pulmonary nodules.  There are moderate bilateral pleural effusions with slight compressive atelectasis in the left lower lobe. Extensive aortic atherosclerosis. No acute osseous abnormalities. The visualized portion of the upper abdomen is normal.  Review of the MIP images confirms the above findings.  IMPRESSION: 1. Large pulmonary embolus occluding the right lower lobe pulmonary arteries. The embolus also extends into the posterior branch of the right upper lobe pulmonary artery. 2. Large thrombus on the medial wall of the ascending thoracic aorta. 3. 6 mm nodule in the left upper lobe. If the patient is at high risk for bronchogenic carcinoma, follow-up chest  CT at 6-12 months is recommended. If the patient is at low risk for bronchogenic carcinoma, follow-up chest CT at 12 months is recommended. This recommendation follows the consensus statement: Guidelines for Management of Small Pulmonary Nodules Detected on CT Scans: A Statement from the Fleischner Society as published in Radiology 2005;237:395-400. 4. Moderate bilateral pleural effusions, left greater than right.   Electronically Signed   By: Francene Boyers M.D.   On: 05/14/2015 18:11    Scheduled Meds: . azithromycin  500 mg Intravenous Q24H  . cefTRIAXone (ROCEPHIN)  IV  1 g Intravenous Q24H  . levothyroxine  100 mcg Oral QAC breakfast  . lip balm   Topical Once  . sodium chloride  3 mL Intravenous Q12H  . sodium chloride  3 mL Intravenous Q12H   Continuous Infusions: . heparin 800 Units/hr (05/13/15 0524)    Principal Problem:   Pulmonary embolism Active Problems:   Respiratory failure   Hypothyroidism    Time spent: 35 minutes.     [name] A  [facility] Pager [phone]. If  7PM-7AM, please contact night-coverage at [email or web address], password [facility] 05/13/2015, 8:16 AM  LOS: 1 day

## 2015-05-13 NOTE — Progress Notes (Signed)
ANTICOAGULATION CONSULT NOTE - follow up Consult  Pharmacy Consult for Heparin Indication: pulmonary embolus  No Known Allergies  Patient Measurements: Height:  (160 cm) Weight: 116 lb 2.9 oz (52.7 kg) IBW/kg (Calculated) : 52.4  Vital Signs: Temp: 97.6 F (36.4 C) (09/15 1200) Temp Source: Axillary (09/15 1200) BP: 105/59 mmHg (09/15 1200) Pulse Rate: 92 (09/15 1553)  Labs:  Recent Labs  05/23/2015 1534 05/13/15 0340 05/13/15 1511  HGB 13.7 13.6  --   HCT 46.5* 45.0  --   PLT 390 336  --   HEPARINUNFRC  --  0.26* 0.36  CREATININE 0.56 0.50  --   TROPONINI 0.03  --   --     Estimated Creatinine Clearance: 44.1 mL/min (by C-G formula based on Cr of 0.5).   Medical History: Past Medical History  Diagnosis Date  . Arthritis   . Thyroid disease     Assessment: 81 year old female presents to the ED with weakness and shortness of breath.  Of note, she fell Monday and hit her head but was not medically examined. Today in ED, head CT did not show any abnormalities.  Chest CTa did show a large PE.  Pharmacy is consulted to dose IV heparin.  Patient is noted to have a history of DVT in January of 2016 for which she was prescribed Xarelto.  She was instructed by MD to stop taking this approximately 2 months ago when she stopped taking estrogen.  Today, 05/13/2015  Heparin level therapeutic after heparin rate increased early this morning from 750 units/hr to 800 units/hr  No reported bleeding or problems with heparin line per RN  Goal of Therapy:  Heparin level 0.3-0.7 units/ml Monitor platelets by anticoagulation protocol: Yes   Plan:  1) Continue current rate of 800 units/hr 2) Recheck heparin level at 2200 to confirm continued goal level at current rate   Hessie Knows, PharmD, BCPS Pager [phone] 05/13/2015 4:39 PM

## 2015-05-13 NOTE — Progress Notes (Signed)
   05/13/15 1100  Clinical Encounter Type  Visited With Patient and family together  Visit Type Initial;Psychological support;Spiritual support;Critical Care  Referral From Other (Comment) (Unit Secretery )  Consult/Referral To Chaplain  Spiritual Encounters  Spiritual Needs Emotional;Other (Comment) Veterinary surgeon)  Stress Factors  Patient Stress Factors Health changes  Family Stress Factors Health changes   The chaplain was referred to the patient by the unit secretary. The Chaplain was informed that the patient had been admitted the previous day and that her husband was at the bedside and could use support.  When the Chaplain entered the room the husband was at the bedside. The patient was confused and did not recognize her husband. The husband verbalized that he thought it was the medication that was causing the patient to be confused. He was receptive to [place] care.  The patient told the Chaplain and her husband that her arm was hurting her. The Chaplain informed the nurse of this. Chaplain interventions included emotional support, pastoral conversation, gifting of prayer shawl and gifting of bone pillow for the patient.  The patient's husband would like a follow up visit from the Chaplain. The Chaplain will follow up with the patient and her husband.

## 2015-05-13 NOTE — Progress Notes (Signed)
Met with patient and her husband to discuss goals of care and symptom management. Kim Flores has progressive and moderately severe vascular dementia. She has had dramatic functional status decline since sustaining a pelvic fracture a few weeks ago. Her husband is her main caregiver. He understands that she may be approaching EOL. He tells me she is very strong, independent, stubborn but very proud-she would not want to live in her current condition.He has caregiver fatigue- she has been keeping him up at night with her agitation and wandering and sleeps during the day- she is still actively smoking cigarettes and he has to watch her closely.  Plan for now is to observe her for 24 hours- goals are comfort and QOL. She may have sudden acute death and decline-if she stabilizes he is interested in home hospice care.  Will follow up tomorrow.  Full consult note to follow.  Lane Hacker, DO Palliative Medicine

## 2015-05-13 NOTE — Progress Notes (Signed)
Echocardiogram 2D Echocardiogram has been performed.  Kim Flores 05/13/2015, 12:41 PM

## 2015-05-13 NOTE — Progress Notes (Signed)
ANTICOAGULATION CONSULT NOTE - Follow Up Consult  Pharmacy Consult for Heparin Indication: pulmonary embolus  No Known Allergies  Patient Measurements: Height:  (160 cm) Weight: 116 lb 2.9 oz (52.7 kg) IBW/kg (Calculated) : 52.4 Heparin Dosing Weight:   Vital Signs: Temp: 97.8 F (36.6 C) (09/15 0330) Temp Source: Axillary (09/15 0330) BP: 107/52 mmHg (09/15 0400) Pulse Rate: 88 (09/15 0400)  Labs:  Recent Labs  05/29/2015 1534 05/13/15 0340  HGB 13.7 13.6  HCT 46.5* 45.0  PLT 390 336  HEPARINUNFRC  --  0.26*  CREATININE 0.56 0.50  TROPONINI 0.03  --     Estimated Creatinine Clearance: 44.1 mL/min (by C-G formula based on Cr of 0.5).   Medications:  Infusions:  . heparin      Assessment: Patient with low heparin level.  IV line was changed about midnight and off for about 20-30 mins.    Goal of Therapy:  Heparin level 0.3-0.7 units/ml Monitor platelets by anticoagulation protocol: Yes   Plan:  Increase heparin to 800 units/hr Heparin level at 1400  [address] [place] Crowford 05/13/2015,5:18 AM

## 2015-05-13 NOTE — Care Management Note (Signed)
Case Management Note  Patient Details  Name: Kim Flores MRN: [id number] Date of Birth: Aug 15, 1930  Subjective/Objective:                 Large pulmonary embolism and heart strain   Action/Plan: Date:  Sept. 15, 2016 U.R. performed for needs and level of care. Will continue to follow for Case Management needs.  Marcelle Smiling, RN, BSN, [place]   [phone]  Expected Discharge Date:                  Expected Discharge Plan:  Skilled Nursing Facility  In-House Referral:  Clinical Social Work  Discharge planning Services  CM Consult  Post Acute Care Choice:  NA Choice offered to:  NA  DME Arranged:    DME Agency:     HH Arranged:    HH Agency:     Status of Service:  In process, will continue to follow  Medicare Important Message Given:    Date Medicare IM Given:    Medicare IM give by:    Date Additional Medicare IM Given:    Additional Medicare Important Message give by:     If discussed at Long Length of Stay Meetings, dates discussed:    Additional Comments:  Golda Acre, RN 05/13/2015, 10:28 AM

## 2015-05-13 NOTE — Progress Notes (Signed)
ANTIBIOTIC CONSULT NOTE - INITIAL  Pharmacy Consult for Zosyn Indication: R/O aspirational pneumonia  No Known Allergies  Patient Measurements: Height:  (160 cm) Weight: 116 lb 2.9 oz (52.7 kg) IBW/kg (Calculated) : 52.4 Adjusted Body Weight:  Vital Signs: Temp: 97.6 F (36.4 C) (09/15 0800) Temp Source: Axillary (09/15 0800) BP: 107/52 mmHg (09/15 0400) Pulse Rate: 88 (09/15 0400) Intake/Output from previous day: 09/14 0701 - 09/15 0700 In: 312.4 [I.V.:62.4; IV Piggyback:250] Out: 50 [Urine:50] Intake/Output from this shift:    Labs:  Recent Labs  05/14/2015 1534 05/13/15 0340  WBC 8.6 9.9  HGB 13.7 13.6  PLT 390 336  CREATININE 0.56 0.50   Estimated Creatinine Clearance: 44.1 mL/min (by C-G formula based on Cr of 0.5). No results for input(s): VANCOTROUGH, VANCOPEAK, VANCORANDOM, GENTTROUGH, GENTPEAK, GENTRANDOM, TOBRATROUGH, TOBRAPEAK, TOBRARND, AMIKACINPEAK, AMIKACINTROU, AMIKACIN in the last 72 hours.   Microbiology: Recent Results (from the past 720 hour(s))  Urine culture     Status: None (Preliminary result)   Collection Time: 05/16/2015  4:54 PM  Result Value Ref Range Status   Specimen Description URINE, CLEAN CATCH  Final   Special Requests NONE  Final   Culture   Final    TOO YOUNG TO READ Performed at [facility]    Report Status PENDING  Incomplete  MRSA PCR Screening     Status: None   Collection Time: 05/21/2015  8:26 PM  Result Value Ref Range Status   MRSA by PCR NEGATIVE NEGATIVE Final    Comment:        The GeneXpert MRSA Assay (FDA approved for NASAL specimens only), is one component of a comprehensive MRSA colonization surveillance program. It is not intended to diagnose MRSA infection nor to guide or monitor treatment for MRSA infections.     Medical History: Past Medical History  Diagnosis Date  . Arthritis   . Thyroid disease     Assessment: 81 y/o female presented to ED with large PE, O2 sat 50% on RA.  CXR  also showed bilateral infiltrates.  MD concerned about risk of aspirational pneumonia, consulted SLP and pharmacy to dose Zosyn.  CrCl 44 mL/min, WBC 9.9, afebrile, HR 88, RR 22, hypotensive  9/14 >> Ceftriaxone >> 9/14 9/14 >> Azithromycin >> 9/15 >> Zosyn >>  Cultures 9/14 blood: sent 9/14 urine: pending  Goal of Therapy:  Eradication of infection  Plan:  --Initiate Zosyn 3.375 g q8h infused of 240 minutes --Monitor renal function, clinical course --F/U SLP to rule out aspirational pneumonia   Kathlynn Grate 05/13/2015,9:04 AM

## 2015-05-14 DIAGNOSIS — R0902 Hypoxemia: Secondary | ICD-10-CM

## 2015-05-14 DIAGNOSIS — E038 Other specified hypothyroidism: Secondary | ICD-10-CM

## 2015-05-14 DIAGNOSIS — J189 Pneumonia, unspecified organism: Secondary | ICD-10-CM

## 2015-05-14 DIAGNOSIS — E870 Hyperosmolality and hypernatremia: Secondary | ICD-10-CM

## 2015-05-14 DIAGNOSIS — G9340 Encephalopathy, unspecified: Secondary | ICD-10-CM

## 2015-05-14 DIAGNOSIS — Z515 Encounter for palliative care: Secondary | ICD-10-CM | POA: Insufficient documentation

## 2015-05-14 DIAGNOSIS — E440 Moderate protein-calorie malnutrition: Secondary | ICD-10-CM | POA: Insufficient documentation

## 2015-05-14 LAB — CBC
HCT: 41.7 % (ref 36.0–46.0)
Hemoglobin: 12.6 g/dL (ref 12.0–15.0)
MCH: 27 pg (ref 26.0–34.0)
MCHC: 30.2 g/dL (ref 30.0–36.0)
MCV: 89.3 fL (ref 78.0–100.0)
PLATELETS: 335 10*3/uL (ref 150–400)
RBC: 4.67 MIL/uL (ref 3.87–5.11)
RDW: 16.6 % — AB (ref 11.5–15.5)
WBC: 10.2 10*3/uL (ref 4.0–10.5)

## 2015-05-14 LAB — BASIC METABOLIC PANEL
Anion gap: 7 (ref 5–15)
BUN: 8 mg/dL (ref 6–20)
CO2: 37 mmol/L — ABNORMAL HIGH (ref 22–32)
Calcium: 8.4 mg/dL — ABNORMAL LOW (ref 8.9–10.3)
Chloride: 100 mmol/L — ABNORMAL LOW (ref 101–111)
Creatinine, Ser: 0.46 mg/dL (ref 0.44–1.00)
GFR calc Af Amer: >60 mL/min (ref >60)
GLUCOSE: 101 mg/dL — AB (ref 65–99)
POTASSIUM: 3.5 mmol/L (ref 3.5–5.1)
Sodium: 144 mmol/L (ref 135–145)

## 2015-05-14 LAB — T4, FREE: Free T4: 1.43 ng/dL — ABNORMAL HIGH (ref 0.61–1.12)

## 2015-05-14 LAB — HEPARIN LEVEL (UNFRACTIONATED)
Heparin Unfractionated: 0.37 IU/mL (ref 0.30–0.70)
Heparin Unfractionated: 0.45 IU/mL (ref 0.30–0.70)

## 2015-05-14 LAB — URINE CULTURE

## 2015-05-14 MED ORDER — ALPRAZOLAM 0.25 MG PO TABS
0.2500 mg | ORAL_TABLET | Freq: Two times a day (BID) | ORAL | Status: DC | PRN
  Administered 2015-05-14 – 2015-05-15 (×3): 0.25 mg via ORAL
  Filled 2015-05-14 (×3): qty 1

## 2015-05-14 MED ORDER — IPRATROPIUM-ALBUTEROL 0.5-2.5 (3) MG/3ML IN SOLN
3.0000 mL | Freq: Four times a day (QID) | RESPIRATORY_TRACT | Status: DC
  Administered 2015-05-14 (×2): 3 mL via RESPIRATORY_TRACT
  Filled 2015-05-14: qty 3

## 2015-05-14 MED ORDER — ENSURE ENLIVE PO LIQD
237.0000 mL | Freq: Two times a day (BID) | ORAL | Status: DC
  Administered 2015-05-14: 237 mL via ORAL

## 2015-05-14 NOTE — Progress Notes (Signed)
ANTICOAGULATION CONSULT NOTE - Follow Up Consult  Pharmacy Consult for heparin Indication: pulmonary embolus  No Known Allergies  Patient Measurements: Height:  (160 cm) Weight: 116 lb 2.9 oz (52.7 kg) IBW/kg (Calculated) : 52.4 Heparin Dosing Weight: actual weight  Vital Signs: Temp: 98.8 F (37.1 C) (09/16 0332) Temp Source: Oral (09/16 0332) BP: 111/78 mmHg (09/16 0600) Pulse Rate: 82 (09/16 0400)  Labs:  Recent Labs  May 27, 2015 1534  05/13/15 0340 05/13/15 1511 05/13/15 2155 05/14/15 0345 05/14/15 0815  HGB 13.7  --  13.6  --   --  12.6  --   HCT 46.5*  --  45.0  --   --  41.7  --   PLT 390  --  336  --   --  335  --   HEPARINUNFRC  --   < > 0.26* 0.36 0.19*  --  0.37  CREATININE 0.56  --  0.50  --   --  0.46  --   TROPONINI 0.03  --   --   --   --   --   --   < > = values in this interval not displayed.  Estimated Creatinine Clearance: 44.1 mL/min (by C-G formula based on Cr of 0.46).   Medications:  Infusions:  . heparin 950 Units/hr (05/14/15 0700)    Assessment: 81 year old female presented to the ED 9/14 with weakness and shortness of breath. Of note, she fell Monday and hit her head but was not medically examined.  In ED, head CT did not show any abnormalities. Chest CTa did show a large PE. Pharmacy is consulted to dose IV heparin.  Patient is noted to have a history of DVT in January of 2016 for which she was prescribed Xarelto. She was instructed by MD to stop taking this approximately 2 months ago when she stopped taking estrogen.  Today, 05/14/2015: 9/16 HL  = 0.37 Hgb trended down to 12.6 (from 13.6).  RN did not note any signs of bleeding or other concerns.  Platelets at 335. 9/16 HL  = _____  Goal of Therapy:  Heparin level 0.3-0.7 units/ml Monitor platelets by anticoagulation protocol: Yes   Plan:  --Continue Heparin 950 units/hr --Recheck HL at 1400 -- Daily heparin level and CBC -- Continue to monitor H&H and  platelets -- Follow up plan for long-term anticoagulation.   Kathlynn Grate 05/14/2015,9:11 AM   Lynann Beaver PharmD, BCPS Pager [phone] 05/14/2015 9:26 AM

## 2015-05-14 NOTE — Evaluation (Signed)
Clinical/Bedside Swallow Evaluation Patient Details  Name: Kim Flores MRN: [id number] Date of Birth: 11-24-1929  Today's Date: 05/14/2015 Time: SLP Start Time (ACUTE ONLY): 1435 SLP Stop Time (ACUTE ONLY): 1515 SLP Time Calculation (min) (ACUTE ONLY): 40 min  Past Medical History:  Past Medical History  Diagnosis Date  . Arthritis   . Thyroid disease    Past Surgical History:  Past Surgical History  Procedure Laterality Date  . Appendectomy    . Abdominal hysterectomy    . Breast surgery    . Fracture surgery     HPI:  81 yo female adm to [facility] with hypoxic respiratory issues.  Pt diagnosed with pleural effusion left more than right, and right lower lobe pulmonary embolism.  Swallow evalution was ordered.  Pt is on 4 liters of oxygen and note palliative following - note goal may be full comfort if pt declines medically.  Spouse present and reports pt with 12 pound weight loss in one year and baseline coughing - with and without intake.  Spouse reported to this SLP that pt does NOT cough more with intake than without.     Assessment / Plan / Recommendation Clinical Impression  Pt with negative CN exam, xerostomic oral cavity noted however.   Pt is weak and her greatest aspiration risk likely due to respiratory issues -  impacting coordination of swallow/breathing.  Pt tolerated ice, icecream, pudding and graham crackers well.  Cough x1 (subtle) after slp administered water via cup noted.  When pt is self feeding liquids, she appears to tolerate intake better and advised her to continue.  Baseline cough noted that is weak and appears congested - ? pharyngeal secretions.   Intake has been poor prior to admit as pt reports she has no appetite.    Recommend modify diet to increase comfort with intake.  SlP to follow up x1 for family education.  Using teach back and written precautions, educated pt and family to findings/recommendations.     Aspiration Risk  Moderate    Diet  Recommendation Dysphagia 3 (Mech soft);Thin   Medication Administration: Whole meds with puree (start and follow with liquids) Compensations: Slow rate;Small sips/bites;Check for pocketing;Follow solids with liquid (rest if short of breath or coughing)    Other  Recommendations Oral Care Recommendations: Oral care BID   Follow Up Recommendations    n/a    Frequency and Duration min 1 x/week  1 week   Pertinent Vitals/Pain AFEBRILE      Swallow Study Prior Functional Status   see hhx     General Date of Onset: 05/14/15 Other Pertinent Information: 81 yo female adm to [facility] with hypoxic respiratory issues.  Pt diagnosed with pleural effusion left more than right, and right lower lobe pulmonary embolism.  Swallow evalution was ordered.  Pt is on 4 liters of oxygen and note palliative following - note goal may be full comfort if pt declines medically.  Spouse present and reports pt with 12 pound weight loss in one year and baseline coughing - with and without intake.  Spouse reported to this SLP that pt does NOT cough more with intake than without.   Type of Study: Bedside swallow evaluation Diet Prior to this Study: Dysphagia 3 (soft);Thin liquids Respiratory Status: Supplemental O2 delivered via (comment) (4 liters) Behavior/Cognition: Alert;Confused;Other (Comment) (pt worried regarding falling even after reassurance) Self-Feeding Abilities: Able to feed self;Needs assist;Needs set up (pt with "shaking" while trying to self feed, which spouse attributes to her nervousness  re: fall) Patient Positioning: Upright in bed Baseline Vocal Quality: Low vocal intensity;Breathy;Hoarse Volitional Cough: Weak Volitional Swallow: Unable to elicit    Oral/Motor/Sensory Function Overall Oral Motor/Sensory Function: Impaired (generalized weakness, xerostomic oral cavity)   Ice Chips Ice chips: Within functional limits Presentation: Spoon   Thin Liquid Thin Liquid: Impaired Presentation:  Cup;Spoon;Straw Oral Phase Impairments: Reduced lingual movement/coordination Pharyngeal  Phase Impairments: Multiple swallows;Cough - Delayed Other Comments: cough x1 only of approx 6 boluses - when slp provided bolus -vs pt self feeding    Nectar Thick Nectar Thick Liquid: Not tested   Honey Thick Honey Thick Liquid: Not tested   Puree Puree: Within functional limits Presentation: Self Fed;Spoon   Solid   GO    Solid: Impaired Presentation: Self Fed Oral Phase Impairments: Reduced lingual movement/coordination;Impaired anterior to posterior transit Oral Phase Functional Implications: Other (comment) (prolonged oral transiting)       Donavan Burnet, MS [facility] SLP [phone]

## 2015-05-14 NOTE — Progress Notes (Signed)
ANTICOAGULATION CONSULT NOTE - Follow Up Consult  Pharmacy Consult for Heparin Indication: pulmonary embolus  No Known Allergies  Patient Measurements: Height:  (160 cm) Weight: 116 lb 2.9 oz (52.7 kg) IBW/kg (Calculated) : 52.4 Heparin Dosing Weight:52.7 kg  Vital Signs: Temp: 98.5 F (36.9 C) (09/16 1600) Temp Source: Oral (09/16 1600) BP: 127/67 mmHg (09/16 1500) Pulse Rate: 110 (09/16 1500)  Labs:  Recent Labs  2015-05-27 1534 05/13/15 0340  05/13/15 2155 05/14/15 0345 05/14/15 0815 05/14/15 1610  HGB 13.7 13.6  --   --  12.6  --   --   HCT 46.5* 45.0  --   --  41.7  --   --   PLT 390 336  --   --  335  --   --   HEPARINUNFRC  --  0.26*  < > 0.19*  --  0.37 0.45  CREATININE 0.56 0.50  --   --  0.46  --   --   TROPONINI 0.03  --   --   --   --   --   --   < > = values in this interval not displayed.  Estimated Creatinine Clearance: 44.1 mL/min (by C-G formula based on Cr of 0.46).   Medications:  Scheduled:  . azithromycin  500 mg Intravenous Q24H  . feeding supplement (ENSURE ENLIVE)  237 mL Oral BID BM  . levothyroxine  100 mcg Oral QAC breakfast  . lip balm   Topical Once  . nicotine  14 mg Transdermal Daily  . piperacillin-tazobactam (ZOSYN)  IV  3.375 g Intravenous Q8H  . sodium chloride  3 mL Intravenous Q12H  . sodium chloride  3 mL Intravenous Q12H   Infusions:  . heparin 950 Units/hr (05/14/15 0700)   PRN: sodium chloride, acetaminophen **OR** acetaminophen, albuterol, ALPRAZolam, alum & mag hydroxide-simeth, morphine injection, ondansetron **OR** ondansetron (ZOFRAN) IV, oxyCODONE, senna-docusate, sodium chloride  Assessment: 85 yoF on heparin drip for PE. Heparin running at 950 units/hr and repeat HL=0.45. No bleeding per RN.   Goal of Therapy:  Heparin level 0.3-0.7 units/ml Monitor platelets by anticoagulation protocol: Yes    Plan:  Will continue heparin drip at 950 units/hr Will f/u AM HL and CBC  Dorethea Clan 05/14/2015,4:47 PM

## 2015-05-14 NOTE — Progress Notes (Signed)
ANTICOAGULATION CONSULT NOTE - Follow Up Consult  Pharmacy Consult for heparin Indication: pulmonary embolus  No Known Allergies  Patient Measurements: Height:  (160 cm) Weight: 116 lb 2.9 oz (52.7 kg) IBW/kg (Calculated) : 52.4 Heparin Dosing Weight:   Vital Signs: Temp: 98.8 F (37.1 C) (09/15 1948) Temp Source: Oral (09/15 1948) BP: 97/72 mmHg (09/16 0200) Pulse Rate: 80 (09/16 0200)  Labs:  Recent Labs  Jun 03, 2015 1534 05/13/15 0340 05/13/15 1511 05/13/15 2155  HGB 13.7 13.6  --   --   HCT 46.5* 45.0  --   --   PLT 390 336  --   --   HEPARINUNFRC  --  0.26* 0.36 0.19*  CREATININE 0.56 0.50  --   --   TROPONINI 0.03  --   --   --     Estimated Creatinine Clearance: 44.1 mL/min (by C-G formula based on Cr of 0.5).   Medications:  Infusions:  . dextrose 5% lactated ringers 45 mL/hr at 05/14/15 0200  . heparin 950 Units/hr (05/14/15 0251)    Assessment: Patient with heparin level low.    Goal of Therapy:  Heparin level 0.3-0.7 units/ml Monitor platelets by anticoagulation protocol: Yes   Plan:  Increase heparin to 950 units/hr Recheck level at [address], [place] 05/14/2015,3:15 AM

## 2015-05-14 NOTE — Progress Notes (Addendum)
[facility] PROGRESS NOTE  Kim Flores:[id number] DOB: 09-07-1929 DOA: 05/25/2015 PCP: [name], MD  Assessment/Plan:  1-Acute hypoxemic respiratory failure: hypercapnic; In setting of PE and PNA;  Continue with IV antibiotics and Heparin Gtt CCM was consulted. Awaiting palliative care meeting, pulmonologist recommendation regarding long term anticoagulation. Filter vs xarelto, coumadin.  Will add schedule nebulizer, bilateral wheezing.   2-Pulmonary embolism:  Vitals stables, continue with heparin Gtt.  CCM following. No need for TPA.  ECHO; with mild atrial dilation, decrease right ventricul function.  Awaiting palliative care meeting, pulmonologist recommendation regarding long term anticoagulation. Filter vs xarelto, coumadin.   3-PNA community vs aspiration. :  Chest x ray with bilateral infiltrates.  Will continue with Azithromycin and  zosyn.  Speech evaluation.  Blood culture pending.   4-Hypernatremia: resume diet. Encourage oral intake,. NSL. Improved.   5-Hypothyroidism. continue Synthroid 100 g by mouth daily. TSH low at 0.34. Check free t 3 and T 4 and adjust synthroid as needed.   6-Functional decline/failure to thrive. Multifactorial, acute illness, PE, PNA.  Will need PT.   7-Hypertension; hold BP medications.   8-6 mm nodule in the left upper lobe; she will need follo up scan.  9-UTI; zosyn should cover.  10-Encephalopathy; and underline dementia. Delirium, encephalopathy secondary to acute illness. Patient more alert today  Code Status: DNR Family Communication: Care discussed with husband 9-15 Disposition Plan: might be able to be transfer to regular bed today.    Consultants:  CCM  Procedures: none  Antibiotics:  Ceftriaxone 9-14  Azithromycin 9-14  HPI/Subjective: Patient is alert today, answering question. Feels better. Breathing better. Confuse. Wants to be working around the house.    Objective: Filed Vitals:    05/14/15 0600  BP: 111/78  Pulse:   Temp:   Resp: 29    Intake/Output Summary (Last 24 hours) at 05/14/15 0830 Last data filed at 05/14/15 0700  Gross per 24 hour  Intake   1285 ml  Output      0 ml  Net   1285 ml   Filed Weights   05/16/2015 1812 05/02/2015 2100  Weight: 47.174 kg (104 lb) 52.7 kg (116 lb 2.9 oz)    Exam:   General:  Alert in no acuete distress.   Cardiovascular: S 1, S 2 RRR  Respiratory: Decreased breath sounds, bilateral wheezing.   Abdomen: BS present, soft, nt  Musculoskeletal: no edema  Data Reviewed: Basic Metabolic Panel:  Recent Labs Lab 05/21/2015 1534 05/13/15 0340 05/14/15 0345  NA 143 146* 144  K 3.9 3.9 3.5  CL 101 102 100*  CO2 35* 37* 37*  GLUCOSE 104* 97 101*  BUN 15 13 8   CREATININE 0.56 0.50 0.46  CALCIUM 9.1 8.9 8.4*   Liver Function Tests:  Recent Labs Lab 05/25/2015 1534 05/13/15 0340  AST 36 32  ALT 32 34  ALKPHOS 70 68  BILITOT 0.5 0.4  PROT 7.3 6.6  ALBUMIN 3.5 3.3*   No results for input(s): LIPASE, AMYLASE in the last 168 hours. No results for input(s): AMMONIA in the last 168 hours. CBC:  Recent Labs Lab 05/26/2015 1534 05/13/15 0340 05/14/15 0345  WBC 8.6 9.9 10.2  NEUTROABS 6.3  --   --   HGB 13.7 13.6 12.6  HCT 46.5* 45.0 41.7  MCV 91.7 90.2 89.3  PLT 390 336 335   Cardiac Enzymes:  Recent Labs Lab 04/30/2015 1534  TROPONINI 0.03   BNP (last 3 results)  Recent Labs  05/14/2015 1534  BNP 655.6*    ProBNP (last 3 results) No results for input(s): PROBNP in the last 8760 hours.  CBG:  Recent Labs Lab 05/18/2015 1526 05/13/15 0903  GLUCAP 102* 80    Recent Results (from the past 240 hour(s))  Urine culture     Status: None (Preliminary result)   Collection Time: 05/11/2015  4:54 PM  Result Value Ref Range Status   Specimen Description URINE, CLEAN CATCH  Final   Special Requests NONE  Final   Culture   Final    TOO YOUNG TO READ Performed at [facility]    Report  Status PENDING  Incomplete  Blood culture (routine x 2)     Status: None (Preliminary result)   Collection Time: 05/23/2015  5:12 PM  Result Value Ref Range Status   Specimen Description BLOOD RIGHT HAND  Final   Special Requests IN PEDIATRIC BOTTLE 2CC  Final   Culture   Final    NO GROWTH < 24 HOURS Performed at [facility]    Report Status PENDING  Incomplete  MRSA PCR Screening     Status: None   Collection Time: 05/11/2015  8:26 PM  Result Value Ref Range Status   MRSA by PCR NEGATIVE NEGATIVE Final    Comment:        The GeneXpert MRSA Assay (FDA approved for NASAL specimens only), is one component of a comprehensive MRSA colonization surveillance program. It is not intended to diagnose MRSA infection nor to guide or monitor treatment for MRSA infections.      Studies: Dg Chest 2 View  05/08/2015   CLINICAL DATA:  Severe shortness of breath today.  EXAM: CHEST  2 VIEW  COMPARISON:  August 31, 2014  FINDINGS: The heart size and mediastinal contours are stable. The aorta is tortuous. Heart size is enlarged. There is pulmonary edema. There are small bilateral pleural effusions. Patchy consolidation of bilateral lung bases are noted. The visualized skeletal structures are stable.  IMPRESSION: Pulmonary edema.  Bilateral pleural effusions. Consolidation of bilateral lung bases at least in part due to atelectasis but superimposed pneumonia is not excluded.   Electronically Signed   By: Sherian Rein M.D.   On: 05/18/2015 16:13   Ct Head Wo Contrast  05/16/2015   CLINICAL DATA:  Fall, hit head on desk.  EXAM: CT HEAD WITHOUT CONTRAST  TECHNIQUE: Contiguous axial images were obtained from the base of the skull through the vertex without intravenous contrast.  COMPARISON:  None.  FINDINGS: There is atrophy and chronic small vessel disease changes. No acute intracranial abnormality. Specifically, no hemorrhage, hydrocephalus, mass lesion, acute infarction, or significant intracranial  injury. No acute calvarial abnormality. Visualized paranasal sinuses and mastoids clear. Orbital soft tissues unremarkable.  IMPRESSION: No acute intracranial abnormality.  Atrophy, chronic microvascular disease.   Electronically Signed   By: Charlett Nose M.D.   On: 05/03/2015 16:02   Ct Angio Chest Pe W/cm &/or Wo Cm  05/16/2015   CLINICAL DATA:  Hypoxemia.  Bilateral pleural effusions.  EXAM: CT ANGIOGRAPHY CHEST WITH CONTRAST  TECHNIQUE: Multidetector CT imaging of the chest was performed using the standard protocol during bolus administration of intravenous contrast. Multiplanar CT image reconstructions and MIPs were obtained to evaluate the vascular anatomy.  CONTRAST:  OMNIPAQUE IOHEXOL 350 MG/ML SOLN  COMPARISON:  Chest x-rays dated 05/27/2015 and 08/31/2014  FINDINGS: There is a large pulmonary embolus occluding the right lower lobe pulmonary artery. The embolus also extends into a  posterior branch of the right upper lobe pulmonary artery although this is not occlusive.  The patient also has a 19 x 14 x 11 mm thrombus on the medial wall of the ascending thoracic aorta 5 cm above the aortic valve.  There is extensive coronary artery calcification. RV LV ratio is normal.  There is a 6 mm smoothly marginated nodule in the left upper lobe just above the lingula. No other pulmonary nodules.  There are moderate bilateral pleural effusions with slight compressive atelectasis in the left lower lobe. Extensive aortic atherosclerosis. No acute osseous abnormalities. The visualized portion of the upper abdomen is normal.  Review of the MIP images confirms the above findings.  IMPRESSION: 1. Large pulmonary embolus occluding the right lower lobe pulmonary arteries. The embolus also extends into the posterior branch of the right upper lobe pulmonary artery. 2. Large thrombus on the medial wall of the ascending thoracic aorta. 3. 6 mm nodule in the left upper lobe. If the patient is at high risk for bronchogenic  carcinoma, follow-up chest CT at 6-12 months is recommended. If the patient is at low risk for bronchogenic carcinoma, follow-up chest CT at 12 months is recommended. This recommendation follows the consensus statement: Guidelines for Management of Small Pulmonary Nodules Detected on CT Scans: A Statement from the Fleischner Society as published in Radiology 2005;237:395-400. 4. Moderate bilateral pleural effusions, left greater than right.   Electronically Signed   By: Francene Boyers M.D.   On: 05/15/15 18:11    Scheduled Meds: . azithromycin  500 mg Intravenous Q24H  . ipratropium-albuterol  3 mL Nebulization Q6H  . levothyroxine  100 mcg Oral QAC breakfast  . lip balm   Topical Once  . nicotine  14 mg Transdermal Daily  . piperacillin-tazobactam (ZOSYN)  IV  3.375 g Intravenous Q8H  . sodium chloride  3 mL Intravenous Q12H  . sodium chloride  3 mL Intravenous Q12H   Continuous Infusions: . heparin 950 Units/hr (05/14/15 0700)    Principal Problem:   Pulmonary embolism Active Problems:   Respiratory failure   Hypothyroidism   Hypoxemia    Time spent: 35 minutes.     [name] A  [facility] Pager [phone]. If 7PM-7AM, please contact night-coverage at [email or web address], password [facility] 05/14/2015, 8:30 AM  LOS: 2 days

## 2015-05-14 NOTE — Progress Notes (Signed)
Daily Progress Note   Patient Name: Kim Flores       Date: 05/14/2015 DOB: Mar 22, 1930  Age: 81 y.o. MRN#: [id number] Attending Physician: Elmarie Shiley, MD Primary Care Physician: Mayra Neer, MD Admit Date: 05/18/2015  Reason for Consultation/Follow-up: Establishing goals of care  Subjective: Pt more alert today. Very confused, agitated this afternoon. She tells me she is still [facility] but " not too bad". She has been picking at the sheets, pulling 02 off, attempting to disrobe. This afternoon became almost panicky; told RN she thought she was falling. She was given a xanax with some relief. Met with both sons as well as pt's husband. Sons share that this is their step-father and they are trying to respect his boundaries but when she fell and fx her pelvis 2 weeks ago they did not know about it. They do all seem to be working well together though. Her husband wants to take her home. I did review that it was my opinion that she met hospice criteria as means of conveying how sick she is in addition to providing support for him as her primary caregiver. We also spent a lot of our time discussing the risks and benefits of anti-coagulation in the setting of advanced dementia and known fall risk. Husband , and her sons as well ,seem to be leaning towards continuing anti-coagulation on an on-going basis despite the risks that we discussed, "well I just won't let her fall".Husband does not want her on xarelto though. I did explain that heparin was not something that could be continued outside of the hospital setting. I also stressed that the best efforts, in most instances with pt's with moderate to severe dementia, could not prevent a fall. Family at this point wanted to cont with the heparin and see how she does clinically over the weekend. She was also seen by speech therapy who down graded her diet to dysphagia diet. We also reviewed that for dementia pt's aspiration is a cont risk for dev pna  that antibiotics would only be a short term benefit.   Interval Events: More alert, agitated Length of Stay: 2 days  Current Medications: Scheduled Meds:  . azithromycin  500 mg Intravenous Q24H  . feeding supplement (ENSURE ENLIVE)  237 mL Oral BID BM  . levothyroxine  100 mcg Oral QAC breakfast  . lip balm   Topical Once  . nicotine  14 mg Transdermal Daily  . piperacillin-tazobactam (ZOSYN)  IV  3.375 g Intravenous Q8H  . sodium chloride  3 mL Intravenous Q12H  . sodium chloride  3 mL Intravenous Q12H    Continuous Infusions: . heparin 950 Units/hr (05/14/15 0700)    PRN Meds: sodium chloride, acetaminophen **OR** acetaminophen, albuterol, ALPRAZolam, alum & mag hydroxide-simeth, morphine injection, ondansetron **OR** ondansetron (ZOFRAN) IV, oxyCODONE, senna-docusate, sodium chloride  Palliative Performance Scale: 30%     Vital Signs: BP 111/57 mmHg  Pulse 107  Temp(Src) 98.5 F (36.9 C) (Oral)  Resp 18  Ht $R'5\' 3"'GP$  (1.6 m)  Wt 52.7 kg (116 lb 2.9 oz)  BMI 20.59 kg/m2  SpO2 92% SpO2: SpO2: 92 % O2 Device: O2 Device: Nasal Cannula O2 Flow Rate: O2 Flow Rate (L/min): 4 L/min  Intake/output summary:  Intake/Output Summary (Last 24 hours) at 05/14/15 1809 Last data filed at 05/14/15 1700  Gross per 24 hour  Intake   1390 ml  Output    400 ml  Net    990 ml   LBM:  Baseline Weight: Weight: 47.174 kg (104 lb) Most recent weight: Weight: 52.7 kg (116 lb 2.9 oz)  Physical Exam: Gen: Elderly female, appears acutely ill. She is confused and agitated Resp: Mild work of breathing noted   Psych: Very confused. Talking about being in a barn, inquiring whether there are rats            Additional Data Reviewed: Recent Labs     05/13/15  0340  05/14/15  0345  WBC  9.9  10.2  HGB  13.6  12.6  PLT  336  335  NA  146*  144  BUN  13  8  CREATININE  0.50  0.46     Problem List:  Patient Active Problem List   Diagnosis Date Noted  . Acute encephalopathy  05/14/2015  . Hypernatremia 05/14/2015  . PNA (pneumonia) 05/14/2015  . Malnutrition of moderate degree 05/14/2015  . Hypoxemia   . Pulmonary embolism 05/16/2015  . Respiratory failure 05/26/2015  . Hypothyroidism 05/05/2015     Palliative Care Assessment & Plan    Code Status:  DNR  Goals of Care:  Family undecided about specific goals going forward. I see husband not fully grasping the degree of her dementia and its impact clinically other than on confusion and memory e.g. dysphagia  Recommended hospice, but no firm decision yet. Hopeful for some improvement with goal to go home  Family undecided about anti-coagulation. I did share that it was not recommended clinically that she cont anti-coagulation on a long term basis because of fall risk. Husband still thinking in terms that he can prevent her from falling  Following clinically over the weekend and hopefully will be able to assist family moving forward at least with home hospice on board. Most home hospices will take pt's on coumadin and do blood draws for coumadin clinics. They would be a good resource down the road to help family transition to full comfort route.  Cont DNR  Symptom Management:  Anxiety/agitation: Cont low dose xanax for now. Will monitor for need for ativan injectable  Dyspnea: Cont 02 as tolerated as well as ms04 $Remov'2mg'yZgRVv$  q4 prn or po oxycodone. Monitor for need for scheduled dosing  Psycho-social/Spiritual:  Desire for further Chaplaincy support:no   Prognosis: < 6 months. Barring an acute event secondary to pulmonary emboli Discharge Planning: Pending but anticipate home with hospice support but still discussing with family   Care plan was discussed with Dr. Tyrell Antonio  Thank you for allowing the Palliative Medicine Team to assist in the care of this patient.   Time In: 1630 Time Out: 1715 Total Time 45 min Prolonged Time Billed  no     Greater than 50%  of this time was spent counseling and  coordinating care related to the above assessment and plan.   Dory Horn, NP  05/14/2015, 6:09 PM  Please contact Palliative Medicine Team phone at [phone] for questions and concerns.

## 2015-05-14 NOTE — Progress Notes (Signed)
   05/14/15 1400  Clinical Encounter Type  Visited With Patient and family together  Visit Type Follow-up;Psychological support;Spiritual support;Critical Care  Referral From Nurse  Consult/Referral To Chaplain  Spiritual Encounters  Spiritual Needs Emotional;Other (Comment) (Pastoral Conversation)  Stress Factors  Patient Stress Factors Health changes;Other (Comment) (Lack of ability to get out of bed)  Family Stress Factors Health changes   The Chaplain followed up with the patient and her husband from a previous visit the day before. The patient needed to use the restroom, so the Chaplain talked to the husband in the hallway while the nurses aided the patient.  The husband felt that the patient seemed more irritated today than the day before. He was concerned that she was trying to get out of bed and leave.  The husband stated that he was getting plenty of rest and has good support from family.  The patient was more coherent today and was able to have a conversation with the Chaplain. The patient needed something to occupy her time while in the hospital and in bed. She was open to watching television and working on an activity belt provided by the nurse.  The patient and her husband are receptive to a follow-up from the Chaplain.  The Chaplain will follow up with the patient and her husband at a later time.

## 2015-05-14 NOTE — Progress Notes (Signed)
Initial Nutrition Assessment  DOCUMENTATION CODES:   Non-severe (moderate) malnutrition in context of acute illness/injury  INTERVENTION:  - Will order Ensure Enlive BID, each supplement provides 350 kcal and 20 grams of protein - Encourage PO intakes during meals and assist with meal tray set-up at bedside - RD will continue to monitor for needs  NUTRITION DIAGNOSIS:   Inadequate oral intake related to poor appetite as evidenced by per patient/family report.  GOAL:   Patient will meet greater than or equal to 90% of their needs  MONITOR:   PO intake, Supplement acceptance, Weight trends, Labs, I & O's  REASON FOR ASSESSMENT:   Malnutrition Screening Tool  ASSESSMENT:   81 y.o. female with a past medical history of DVT, previously had been on Xarelto therapy, cognitive impairment, hypertension, hypothyroidism, presenting to the emergency room in hypoxemic respiratory failure. Patient's husband reporting that she has had a progressive decline over the past 2-3 weeks becoming progressively weaker, having recurrent falls, right greater assistance with activities of daily living. She has had difficulties just getting around her home. Husband reports that she had a fall on Monday injuring her left lower extremity. Today she had a fall coming out of the bathroom. Her husband described her legs simply giving out.  Pt seen for MST. BMI indicates normal weight status. No intakes documented. Pt was anxious this AM, states several times need to get out of bed to urinate despite catheter. Family at bedside attending to pt and do not answer questions readily despite several attempts by RD. Husband does state that pt only ate jello for dinner last night and that pt had a poor appetite PTA.  Unable to determine weight trends PTA and limited weight hx available in chart. Physical assessment not done but visualized moderate fat and severe muscle wasting to upper body.  Will order Ensure Enlive BID  to supplement. Not meeting needs. Medications reviewed. Labs reviewed; Cl: 100 mmol/L, Ca: 8.4 mg/dL.   Diet Order:  DIET SOFT Room service appropriate?: Yes; Fluid consistency:: Thin  Skin:  Reviewed, no issues  Last BM:  PTA  Height:   Ht Readings from Last 1 Encounters:  May 25, 2015  (1.6 m)    Weight:   Wt Readings from Last 1 Encounters:  2015-05-25 116 lb 2.9 oz (52.7 kg)    Ideal Body Weight:  52.27 kg (kg)  BMI:  Body mass index is 20.59 kg/(m^2).  Estimated Nutritional Needs:   Kcal:  1150-1350  Protein:  50-60 grams  Fluid:  1.8-2 L/day  EDUCATION NEEDS:   No education needs identified at this time     Trenton Gammon, RD, LDN Inpatient Clinical Dietitian Pager # [phone] After hours/weekend pager # [phone]

## 2015-05-15 ENCOUNTER — Inpatient Hospital Stay (HOSPITAL_COMMUNITY): Payer: Medicare Other

## 2015-05-15 DIAGNOSIS — G9340 Encephalopathy, unspecified: Secondary | ICD-10-CM

## 2015-05-15 DIAGNOSIS — E870 Hyperosmolality and hypernatremia: Secondary | ICD-10-CM

## 2015-05-15 DIAGNOSIS — F0391 Unspecified dementia with behavioral disturbance: Secondary | ICD-10-CM | POA: Insufficient documentation

## 2015-05-15 DIAGNOSIS — F03918 Unspecified dementia, unspecified severity, with other behavioral disturbance: Secondary | ICD-10-CM | POA: Insufficient documentation

## 2015-05-15 LAB — HEPARIN LEVEL (UNFRACTIONATED): Heparin Unfractionated: 0.45 IU/mL (ref 0.30–0.70)

## 2015-05-15 LAB — BASIC METABOLIC PANEL
Anion gap: 6 (ref 5–15)
BUN: 9 mg/dL (ref 6–20)
CHLORIDE: 102 mmol/L (ref 101–111)
CO2: 37 mmol/L — AB (ref 22–32)
CREATININE: 0.46 mg/dL (ref 0.44–1.00)
Calcium: 8.3 mg/dL — ABNORMAL LOW (ref 8.9–10.3)
GFR calc Af Amer: >60 mL/min (ref >60)
GFR calc non Af Amer: >60 mL/min (ref >60)
Glucose, Bld: 101 mg/dL — ABNORMAL HIGH (ref 65–99)
Potassium: 3.5 mmol/L (ref 3.5–5.1)
SODIUM: 145 mmol/L (ref 135–145)

## 2015-05-15 LAB — CBC
HCT: 45.8 % (ref 36.0–46.0)
HEMOGLOBIN: 13.6 g/dL (ref 12.0–15.0)
MCH: 26.9 pg (ref 26.0–34.0)
MCHC: 29.7 g/dL — AB (ref 30.0–36.0)
MCV: 90.7 fL (ref 78.0–100.0)
Platelets: 301 10*3/uL (ref 150–400)
RBC: 5.05 MIL/uL (ref 3.87–5.11)
RDW: 16.8 % — ABNORMAL HIGH (ref 11.5–15.5)
WBC: 12.1 10*3/uL — ABNORMAL HIGH (ref 4.0–10.5)

## 2015-05-15 LAB — T3, FREE: T3, Free: 1.8 pg/mL — ABNORMAL LOW (ref 2.0–4.4)

## 2015-05-15 MED ORDER — IPRATROPIUM-ALBUTEROL 0.5-2.5 (3) MG/3ML IN SOLN
3.0000 mL | Freq: Three times a day (TID) | RESPIRATORY_TRACT | Status: DC
  Administered 2015-05-16 (×3): 3 mL via RESPIRATORY_TRACT
  Filled 2015-05-15 (×4): qty 3

## 2015-05-15 MED ORDER — LORAZEPAM 2 MG/ML IJ SOLN
0.5000 mg | Freq: Three times a day (TID) | INTRAMUSCULAR | Status: DC | PRN
  Administered 2015-05-16: 0.5 mg via INTRAVENOUS
  Filled 2015-05-15: qty 1

## 2015-05-15 MED ORDER — IPRATROPIUM-ALBUTEROL 0.5-2.5 (3) MG/3ML IN SOLN
3.0000 mL | Freq: Four times a day (QID) | RESPIRATORY_TRACT | Status: DC
  Administered 2015-05-15 (×2): 3 mL via RESPIRATORY_TRACT
  Filled 2015-05-15 (×2): qty 3

## 2015-05-15 NOTE — Progress Notes (Signed)
[facility] PROGRESS NOTE  Kim Flores WUJ:[id number] DOB: [birth year] DOA: 06-09-15 PCP: [name], MD  Assessment/Plan:  1-Acute hypoxemic respiratory failure: hypercapnic; In setting of PE and PNA;  Continue with IV antibiotics and Heparin Gtt Schedule nebulizer, bilateral wheezing.  Requiring more oxygen, back with NB mask.  Repeat Chest x ray, lasix as needed.   2-Pulmonary embolism:  Vitals stables, continue with heparin Gtt.  CCM was  following. No need for TPA.  ECHO; with mild atrial dilation, decrease right ventricul function.  Would continue with heparin for now. Patient more hypoxic today. Discussed option with family filter vs coumadin. Will continue with heparin for now, patient more hypoxic today. Palliative care consult to follow.   3-PNA community vs aspiration. :  Chest x ray with bilateral infiltrates.  Will continue with Azithromycin and  zosyn.  Speech evaluation.  Blood culture pending.   4-Hypernatremia: resume diet. Encourage oral intake,. NSL. Improved.   5-Hypothyroidism. . TSH low at 0.34. Check free t 3 and T 4 and adjust synthroid as needed.  Change synthroid to IV.   6-Functional decline/failure to thrive. Multifactorial, acute illness, PE, PNA.  Will need PT.   7-Hypertension; hold BP medications.   8-6 mm nodule in the left upper lobe; she will need follo up scan.   9-UTI; zosyn should cover.   10-Encephalopathy; and underline dementia. Delirium, encephalopathy secondary to acute illness. Will change ativan to IV.    Code Status: DNR Family Communication: Care discussed with husband and son who were at bedside. 9-17. Explain to family change in condition.  Disposition Plan: continue in step down, palliative care meeting.    Consultants:  CCM  Procedures: none  Antibiotics:  Ceftriaxone 9-14  Azithromycin 9-14  HPI/Subjective: Patient is more hypoxic today. Less interactive. Have been confuse.     Objective: Filed Vitals:   05/15/15 0800  BP:   Pulse:   Temp: 97.5 F (36.4 C)  Resp:     Intake/Output Summary (Last 24 hours) at 05/15/15 0943 Last data filed at 05/15/15 0600  Gross per 24 hour  Intake  593.5 ml  Output    990 ml  Net -396.5 ml   Filed Weights   06/09/2015 1812 2015-06-09 2100 05/15/15 0358  Weight: 47.174 kg (104 lb) 52.7 kg (116 lb 2.9 oz) 52 kg (114 lb 10.2 oz)    Exam:   General:  Alert in no acuete distress.   Cardiovascular: S 1, S 2 RRR  Respiratory: Decreased breath sounds, bilateral wheezing.   Abdomen: BS present, soft, nt  Musculoskeletal: no edema  Data Reviewed: Basic Metabolic Panel:  Recent Labs Lab 06-09-15 1534 05/13/15 0340 05/14/15 0345 05/15/15 0340  NA 143 146* 144 145  K 3.9 3.9 3.5 3.5  CL 101 102 100* 102  CO2 35* 37* 37* 37*  GLUCOSE 104* 97 101* 101*  BUN CREATININE 0.56 0.50 0.46 0.46  CALCIUM 9.1 8.9 8.4* 8.3*   Liver Function Tests:  Recent Labs Lab 09-Jun-2015 1534 05/13/15 0340  AST 36 32  ALT 32 34  ALKPHOS 70 68  BILITOT 0.5 0.4  PROT 7.3 6.6  ALBUMIN 3.5 3.3*   No results for input(s): LIPASE, AMYLASE in the last 168 hours. No results for input(s): AMMONIA in the last 168 hours. CBC:  Recent Labs Lab 06-09-2015 1534 05/13/15 0340 05/14/15 0345 05/15/15 0340  WBC 8.6 9.9 10.2 12.1*  NEUTROABS 6.3  --   --   --  HGB 13.7 13.6 12.6 13.6  HCT 46.5* 45.0 41.7 45.8  MCV 91.7 90.2 89.3 90.7  PLT 390 336 335 301   Cardiac Enzymes:  Recent Labs Lab 05/14/2015 1534  TROPONINI 0.03   BNP (last 3 results)  Recent Labs  05/16/2015 1534  BNP 655.6*    ProBNP (last 3 results) No results for input(s): PROBNP in the last 8760 hours.  CBG:  Recent Labs Lab 05/08/2015 1526 05/13/15 0903  GLUCAP 102* 80    Recent Results (from the past 240 hour(s))  Urine culture     Status: None   Collection Time: 05/03/2015  4:54 PM  Result Value Ref Range Status   Specimen  Description URINE, CLEAN CATCH  Final   Special Requests NONE  Final   Culture   Final    MULTIPLE SPECIES PRESENT, SUGGEST RECOLLECTION Performed at [facility]    Report Status 05/14/2015 FINAL  Final  Blood culture (routine x 2)     Status: None (Preliminary result)   Collection Time: 05/03/2015  5:12 PM  Result Value Ref Range Status   Specimen Description BLOOD RIGHT HAND  Final   Special Requests IN PEDIATRIC BOTTLE 2CC  Final   Culture   Final    NO GROWTH 2 DAYS Performed at [facility]    Report Status PENDING  Incomplete  MRSA PCR Screening     Status: None   Collection Time: 05/28/2015  8:26 PM  Result Value Ref Range Status   MRSA by PCR NEGATIVE NEGATIVE Final    Comment:        The GeneXpert MRSA Assay (FDA approved for NASAL specimens only), is one component of Flores comprehensive MRSA colonization surveillance program. It is not intended to diagnose MRSA infection nor to guide or monitor treatment for MRSA infections.      Studies: No results found.  Scheduled Meds: . azithromycin  500 mg Intravenous Q24H  . feeding supplement (ENSURE ENLIVE)  237 mL Oral BID BM  . ipratropium-albuterol  3 mL Nebulization Q6H  . lip balm   Topical Once  . nicotine  14 mg Transdermal Daily  . piperacillin-tazobactam (ZOSYN)  IV  3.375 g Intravenous Q8H  . sodium chloride  3 mL Intravenous Q12H  . sodium chloride  3 mL Intravenous Q12H   Continuous Infusions: . heparin 950 Units/hr (05/15/15 0530)    Principal Problem:   Pulmonary embolism Active Problems:   Respiratory failure   Hypothyroidism   Hypoxemia   Acute encephalopathy   Hypernatremia   PNA (pneumonia)   Malnutrition of moderate degree   Palliative care encounter    Time spent: 25 minutes.     Kim Flores  [facility] Pager [phone]. If 7PM-7AM, please contact night-coverage at [email or web address], password [facility] 05/15/2015, 9:43 AM  LOS: 3 days

## 2015-05-15 NOTE — Progress Notes (Signed)
ANTICOAGULATION CONSULT NOTE - Follow Up Consult  Pharmacy Consult for Heparin Indication: pulmonary embolus  No Known Allergies  Patient Measurements: Height:  (160 cm) Weight: 114 lb 10.2 oz (52 kg) IBW/kg (Calculated) : 52.4 Heparin Dosing Weight: actual weight  Vital Signs: Temp: 98.4 F (36.9 C) (09/17 0358) Temp Source: Axillary (09/17 0358) BP: 160/55 mmHg (09/17 0600) Pulse Rate: 78 (09/17 0600)  Labs:  Recent Labs  05/28/2015 1534 05/13/15 0340  05/14/15 0345 05/14/15 0815 05/14/15 1610 05/15/15 0340  HGB 13.7 13.6  --  12.6  --   --  13.6  HCT 46.5* 45.0  --  41.7  --   --  45.8  PLT 390 336  --  335  --   --  301  HEPARINUNFRC  --  0.26*  < >  --  0.37 0.45 0.45  CREATININE 0.56 0.50  --  0.46  --   --  0.46  TROPONINI 0.03  --   --   --   --   --   --   < > = values in this interval not displayed.  Estimated Creatinine Clearance: 43.7 mL/min (by C-G formula based on Cr of 0.46).   Medications:  Infusions:  . heparin 950 Units/hr (05/15/15 0530)    Assessment: 81 year old female presented to the ED 9/14 with weakness and shortness of breath. Of note, she fell 9/12 and hit her head but was not medically examined.  In ED, head CT did not show any abnormalities. Chest CTa did show a large PE. Pharmacy is consulted to dose IV heparin.  Patient is noted to have a history of DVT in January of 2016 for which she was prescribed Xarelto. She was instructed by MD to stop taking this approximately 2 months ago when she stopped taking estrogen.  Today, 05/15/2015: Heparin level = 0.45, remains therapeutic on heparin infusion at 950 units/hr. Hgb improved, 13.6 and Plt remain WNL, 301 RN did not note any signs of bleeding or other concerns.  Goal of Therapy:  Heparin level 0.3-0.7 units/ml Monitor platelets by anticoagulation protocol: Yes   Plan:  --Continue Heparin IV infusion at 950 units/hr -- Daily heparin level and CBC -- Continue to monitor  H&H and platelets -- Follow up plan for long-term anticoagulation.   Lynann Beaver PharmD, BCPS Pager [phone] 05/15/2015 7:04 AM

## 2015-05-15 NOTE — Progress Notes (Signed)
Daily Progress Note   Patient Name: Kim Flores       Date: 05/15/2015 DOB: May 04, 1930  Age: 81 y.o. MRN#: [id number] Attending Physician: Alba Cory, MD Primary Care Physician: [name], MD Admit Date: May 28, 2015  Reason for Consultation/Follow-up: Establishing goals of care  Subjective: Pt declined overnight: worsening agitation, increased shortness of breath. Fluctuating BP , sleeping more today. Husband shares that pt has been telling him for about a month that she is ready to die. Also yesterday she was seeing deceased family members in the room. Continued to support the family and present a more comfort based approach in the setting of advanced dementia, now with PE, asp pna, recurrent falls. One son, Christen Bame has a son who is a physician who has been helpful in terms of providing additional education regarding EOL  Interval Events: Worsening respiratory status, agitation, sleeping more Length of Stay: 3 days  Current Medications: Scheduled Meds:  . azithromycin  500 mg Intravenous Q24H  . feeding supplement (ENSURE ENLIVE)  237 mL Oral BID BM  . ipratropium-albuterol  3 mL Nebulization Q6H  . lip balm   Topical Once  . nicotine  14 mg Transdermal Daily  . piperacillin-tazobactam (ZOSYN)  IV  3.375 g Intravenous Q8H  . sodium chloride  3 mL Intravenous Q12H  . sodium chloride  3 mL Intravenous Q12H    Continuous Infusions: . heparin 950 Units/hr (05/15/15 0530)    PRN Meds: sodium chloride, acetaminophen **OR** acetaminophen, albuterol, alum & mag hydroxide-simeth, LORazepam, morphine injection, ondansetron **OR** ondansetron (ZOFRAN) IV, oxyCODONE, senna-docusate, sodium chloride  Palliative Performance Scale: 30%     Vital Signs: BP 98/48 mmHg  Pulse 87  Temp(Src) 97.4 F (36.3 C) (Axillary)  Resp 25  Ht  (1.6 m)  Wt 52 kg (114 lb 10.2 oz)  BMI 20.31 kg/m2  SpO2 94% SpO2: SpO2: 94 % O2 Device: O2 Device: Venturi Mask O2 Flow Rate: O2  Flow Rate (L/min): 14 L/min  Intake/output summary:  Intake/Output Summary (Last 24 hours) at 05/15/15 1621 Last data filed at 05/15/15 1100  Gross per 24 hour  Intake    691 ml  Output    690 ml  Net      1 ml   LBM:   Baseline Weight: Weight: 47.174 kg (104 lb) Most recent weight: Weight: 52 kg (114 lb 10.2 oz)  Physical Exam: General: frail elderly female, minimally responsive. Wearing venti mask              Additional Data Reviewed: Recent Labs     05/14/15  0345  05/15/15  0340  WBC  10.2  12.1*  HGB  12.6  13.6  PLT  335  301  NA  144  145  BUN  8  9  CREATININE  0.46  0.46     Problem List:  Patient Active Problem List   Diagnosis Date Noted  . Acute encephalopathy 05/14/2015  . Hypernatremia 05/14/2015  . PNA (pneumonia) 05/14/2015  . Malnutrition of moderate degree 05/14/2015  . Palliative care encounter   . Hypoxemia   . Pulmonary embolism 05/28/2015  . Respiratory failure May 28, 2015  . Hypothyroidism 05/28/15     Palliative Care Assessment & Plan    Code Status:  DNR  Goals of Care:  Family still struggling with the Big Picture related to moderate to severe dementia  Feel that they family did hear that further long term anti-coagulation is contraindicated but they stopped short of saying  no definitively. Pt's husband and son Christen Bame present today and leaning towards grasping NO anti-coagulation going forward, but this will need further FU  Presented aspiration pna and disease progression related to this as well as other aspects of functional decline related to dementia  Presented again comfort goals and recommendations  Discussed again hospice in the home, SNF with hospice and potentially hospice in-patient if she declines  Symptom Management:  Dyspnea: Pt now wearing venti mask. Cont with supportive 02 therapy as well as ms04 2mg  iv prn  Agitation: Cont with xanax prn.   Psycho-social/Spiritual:  Desire for further Chaplaincy  support:no   Prognosis: Unable to determine as of yet Discharge Planning: TBD. Presented SNF with hospice. Do not feel that pt is a rehab candidate secondary to moderate/severe dementia; hospice services in the home. Concern that pt is too high of fall risk, unstable for her husband to care for alone; hospice in-patient. Hospice in-patient would be ideal if she qualifies   Care plan was discussed with Dr. Sunnie Nielsen  Thank you for allowing the Palliative Medicine Team to assist in the care of this patient.   Time In: 1400 Time Out: 1445 Total Time 45 min Prolonged Time Billed  no     Greater than 50%  of this time was spent counseling and coordinating care related to the above assessment and plan. Will continue to follow and support holistically.    Irean Hong, NP  05/15/2015, 4:21 PM  Please contact Palliative Medicine Team phone at [phone] for questions and concerns.

## 2015-05-15 NOTE — Progress Notes (Signed)
SLP Cancellation Note  Patient Details Name: Kim Flores MRN: [id number] DOB: [birth year]   Cancelled treatment:       Reason Eval/Treat Not Completed: Medical issues which prohibited therapy; spoke with nursing/RT and pt non-responsive all day; unable to participate in ST   Herbert Marken,PAT, M.S., CCC-SLP 05/15/2015, 4:21 PM

## 2015-05-16 DIAGNOSIS — F0391 Unspecified dementia with behavioral disturbance: Secondary | ICD-10-CM

## 2015-05-16 LAB — BASIC METABOLIC PANEL
Anion gap: 8 (ref 5–15)
BUN: 13 mg/dL (ref 6–20)
CHLORIDE: 99 mmol/L — AB (ref 101–111)
CO2: 38 mmol/L — AB (ref 22–32)
CREATININE: 0.69 mg/dL (ref 0.44–1.00)
Calcium: 8.3 mg/dL — ABNORMAL LOW (ref 8.9–10.3)
GFR calc non Af Amer: >60 mL/min (ref >60)
Glucose, Bld: 83 mg/dL (ref 65–99)
POTASSIUM: 3.8 mmol/L (ref 3.5–5.1)
SODIUM: 145 mmol/L (ref 135–145)

## 2015-05-16 LAB — CBC
HEMATOCRIT: 45.7 % (ref 36.0–46.0)
HEMOGLOBIN: 12.8 g/dL (ref 12.0–15.0)
MCH: 26.9 pg (ref 26.0–34.0)
MCHC: 28 g/dL — AB (ref 30.0–36.0)
MCV: 96 fL (ref 78.0–100.0)
Platelets: 305 10*3/uL (ref 150–400)
RBC: 4.76 MIL/uL (ref 3.87–5.11)
RDW: 17.2 % — ABNORMAL HIGH (ref 11.5–15.5)
WBC: 13.8 10*3/uL — ABNORMAL HIGH (ref 4.0–10.5)

## 2015-05-16 LAB — HEPARIN LEVEL (UNFRACTIONATED): Heparin Unfractionated: 0.59 IU/mL (ref 0.30–0.70)

## 2015-05-16 MED ORDER — LORAZEPAM 2 MG/ML IJ SOLN
0.5000 mg | INTRAMUSCULAR | Status: DC | PRN
  Administered 2015-05-16: 0.5 mg via INTRAVENOUS
  Filled 2015-05-16: qty 1

## 2015-05-16 MED ORDER — MORPHINE SULFATE (PF) 2 MG/ML IV SOLN
1.0000 mg | INTRAVENOUS | Status: DC | PRN
  Administered 2015-05-17: 1 mg via INTRAVENOUS
  Filled 2015-05-16: qty 1

## 2015-05-16 NOTE — Progress Notes (Signed)
ANTICOAGULATION CONSULT NOTE - Follow Up Consult  Pharmacy Consult for Heparin Indication: pulmonary embolus  No Known Allergies  Patient Measurements: Height:  (160 cm) Weight: 119 lb 11.4 oz (54.3 kg) IBW/kg (Calculated) : 52.4 Heparin Dosing Weight: actual weight  Vital Signs: Temp: 98.3 F (36.8 C) (09/18 0446) Temp Source: Axillary (09/18 0446) BP: 111/53 mmHg (09/18 0200) Pulse Rate: 90 (09/18 0100)  Labs:  Recent Labs  05/14/15 0345  05/14/15 1610 05/15/15 0340 05/16/15 0355  HGB 12.6  --   --  13.6  --   HCT 41.7  --   --  45.8  --   PLT 335  --   --  301  --   HEPARINUNFRC  --   < > 0.45 0.45 0.59  CREATININE 0.46  --   --  0.46  --   < > = values in this interval not displayed.  Estimated Creatinine Clearance: 44.1 mL/min (by C-G formula based on Cr of 0.46).   Medications:  Infusions:  . heparin 950 Units/hr (05/15/15 0530)    Assessment: 81 year old female presented to the ED 9/14 with weakness and shortness of breath. Of note, she fell 9/12 and hit her head but was not medically examined.  In ED, head CT did not show any abnormalities. Chest CTa did show a large PE. Pharmacy is consulted to dose IV heparin.  Patient is noted to have a history of DVT in January of 2016 for which she was prescribed Xarelto. She was instructed by MD to stop taking this approximately 2 months ago when she stopped taking estrogen.  Today, 05/16/2015: Heparin level = 0.59, remains therapeutic on heparin infusion at 950 units/hr. CBC:  Hgb improved, 13.6 and Plt remain WNL, 301 (last on 9/17) No bleeding or complications per RN  Goal of Therapy:  Heparin level 0.3-0.7 units/ml Monitor platelets by anticoagulation protocol: Yes   Plan:   Continue Heparin IV infusion at 950 units/hr  Daily heparin level and CBC  Continue to monitor H&H and platelets  Follow up plan for long-term anticoagulation.   Lynann Beaver PharmD, BCPS Pager [phone] 05/16/2015  7:05 AM

## 2015-05-16 NOTE — Progress Notes (Addendum)
[facility] PROGRESS NOTE  Kim Flores SEG:[id number] DOB: [birth year] DOA: 05/13/2015 PCP: Mayra Neer, MD  Assessment/Plan:  1-Acute hypoxemic respiratory failure: hypercapnic; In setting of PE and PNA;  Continue with IV antibiotics and Heparin Gtt Schedule nebulizer.  NB mask.  Chest x ray with pulmonary edema. Will have to be careful with lasix due to hypotension and risk of decreasing right side pressure.   2-Pulmonary embolism:  Vitals stables, continue with heparin Gtt.  CCM was  following. No need for TPA. Discussed with Dr Ashok Cordia no thoracentesis indicated.  ECHO; with mild atrial dilation, decrease right ventricul function.  On heparin Gtt.. Palliative care following. Further discussion about comfort measure.   3-PNA community vs aspiration. :  Chest x ray with bilateral infiltrates.  Will continue with Azithromycin and  zosyn.  Speech evaluation.  Blood culture pending.   4-Hypernatremia: resume diet. Encourage oral intake,. NSL. Improved.   5-Hypothyroidism. TSH low at 0.34. Check free t 3 and T 4 and adjust synthroid as needed.  Change synthroid to IV.   6-Functional decline/failure to thrive. Multifactorial, acute illness, PE, PNA.    7-Hypertension; hold BP medications.   8-6 mm nodule in the left upper lobe; she will need follo up scan.   9-UTI; zosyn should cover. Urine culture multiple bacterial morphotype's.   10-Encephalopathy; and underline dementia. Delirium, encephalopathy secondary to acute illness. Will change ativan to IV PRN.    Code Status: DNR Family Communication: Met with palliative care and patient' s family: son and husband. Explain to them that Kim Flores condition is critical, she is not getting better with heparin Gtt, IV antibiotics, we have limitation on treatments with IV lasix. I recommend Morphine for increase work of breathing, ativan for agitation. Move forward with comfort measure.. Palliative care to follow.   Disposition Plan: continue in step down, palliative care meeting.    Consultants:  CCM  Procedures: none  Antibiotics:  Ceftriaxone 9-14  Azithromycin 9-14  HPI/Subjective: Patient on NB mask. Say few words, eyes close.    Objective: Filed Vitals:   05/16/15 0600  BP: 101/41  Pulse:   Temp:   Resp: 24    Intake/Output Summary (Last 24 hours) at 05/16/15 0753 Last data filed at 05/16/15 0600  Gross per 24 hour  Intake  618.5 ml  Output    500 ml  Net  118.5 ml   Filed Weights   05/13/2015 2100 05/15/15 0358 05/16/15 0446  Weight: 52.7 kg (116 lb 2.9 oz) 52 kg (114 lb 10.2 oz) 54.3 kg (119 lb 11.4 oz)    Exam:   General:  On BM   Cardiovascular: S 1, S 2, RRR  Respiratory: Decreased breath sounds, bilateral ronchus.   Abdomen: BS present, soft, nt  Musculoskeletal: no edema  Data Reviewed: Basic Metabolic Panel:  Recent Labs Lab 05/02/2015 1534 05/13/15 0340 05/14/15 0345 05/15/15 0340  NA 143 146* 144 145  K 3.9 3.9 3.5 3.5  CL 101 102 100* 102  CO2 35* 37* 37* 37*  GLUCOSE 104* 97 101* 101*  BUN $Re'15 13 8 9  'tRD$ CREATININE 0.56 0.50 0.46 0.46  CALCIUM 9.1 8.9 8.4* 8.3*   Liver Function Tests:  Recent Labs Lab 05/15/2015 1534 05/13/15 0340  AST 36 32  ALT 32 34  ALKPHOS 70 68  BILITOT 0.5 0.4  PROT 7.3 6.6  ALBUMIN 3.5 3.3*   No results for input(s): LIPASE, AMYLASE in the last 168 hours. No results for input(s): AMMONIA in the last  168 hours. CBC:  Recent Labs Lab 05/13/2015 1534 05/13/15 0340 05/14/15 0345 05/15/15 0340  WBC 8.6 9.9 10.2 12.1*  NEUTROABS 6.3  --   --   --   HGB 13.7 13.6 12.6 13.6  HCT 46.5* 45.0 41.7 45.8  MCV 91.7 90.2 89.3 90.7  PLT 390 336 335 301   Cardiac Enzymes:  Recent Labs Lab 04/30/2015 1534  TROPONINI 0.03   BNP (last 3 results)  Recent Labs  05/10/2015 1534  BNP 655.6*    ProBNP (last 3 results) No results for input(s): PROBNP in the last 8760 hours.  CBG:  Recent Labs Lab  05/11/2015 1526 05/13/15 0903  GLUCAP 102* 80    Recent Results (from the past 240 hour(s))  Urine culture     Status: None   Collection Time: 05/24/2015  4:54 PM  Result Value Ref Range Status   Specimen Description URINE, CLEAN CATCH  Final   Special Requests NONE  Final   Culture   Final    MULTIPLE SPECIES PRESENT, SUGGEST RECOLLECTION Performed at [facility]    Report Status 05/14/2015 FINAL  Final  Blood culture (routine x 2)     Status: None (Preliminary result)   Collection Time: 05/19/2015  5:12 PM  Result Value Ref Range Status   Specimen Description BLOOD RIGHT HAND  Final   Special Requests IN PEDIATRIC BOTTLE 2CC  Final   Culture   Final    NO GROWTH 3 DAYS Performed at [facility]    Report Status PENDING  Incomplete  MRSA PCR Screening     Status: None   Collection Time: 05/24/2015  8:26 PM  Result Value Ref Range Status   MRSA by PCR NEGATIVE NEGATIVE Final    Comment:        The GeneXpert MRSA Assay (FDA approved for NASAL specimens only), is one component of a comprehensive MRSA colonization surveillance program. It is not intended to diagnose MRSA infection nor to guide or monitor treatment for MRSA infections.      Studies: Dg Chest Port 1 View  05/15/2015   CLINICAL DATA:  Dyspnea  EXAM: PORTABLE CHEST - 1 VIEW  COMPARISON:  04/30/2015  FINDINGS: There are bilateral small pleural effusions. There are bilateral interstitial and alveolar airspace opacities. There is no pneumothorax. The heart size is stable.  There is no acute osseous abnormality.  IMPRESSION: Overall findings most concerning for pulmonary edema.   Electronically Signed   By: Kathreen Devoid   On: 05/15/2015 11:07    Scheduled Meds: . azithromycin  500 mg Intravenous Q24H  . feeding supplement (ENSURE ENLIVE)  237 mL Oral BID BM  . ipratropium-albuterol  3 mL Nebulization TID  . lip balm   Topical Once  . nicotine  14 mg Transdermal Daily  . piperacillin-tazobactam  (ZOSYN)  IV  3.375 g Intravenous Q8H  . sodium chloride  3 mL Intravenous Q12H  . sodium chloride  3 mL Intravenous Q12H   Continuous Infusions: . heparin 950 Units/hr (05/15/15 0530)    Principal Problem:   Pulmonary embolism Active Problems:   Respiratory failure   Hypothyroidism   Hypoxemia   Acute encephalopathy   Hypernatremia   PNA (pneumonia)   Malnutrition of moderate degree   Palliative care encounter   Dementia with behavioral disturbance    Time spent: 25 minutes.     Niel Hummer A  [facility] Pager [phone]. If 7PM-7AM, please contact night-coverage at [email or web address], password [facility] 05/16/2015, 7:53  AM  LOS: 4 days

## 2015-05-16 NOTE — Progress Notes (Signed)
Patient received from ICU and placed in bed, patient with 100% non re breather mask. Patient stable and comfortable. Will continue to monitor.

## 2015-05-16 NOTE — Progress Notes (Signed)
ANTIBIOTIC CONSULT NOTE - FOLLOW UP  Pharmacy Consult for Zosyn Indication: pneumonia  No Known Allergies  Patient Measurements: Height:  (160 cm) Weight: 119 lb 11.4 oz (54.3 kg) IBW/kg (Calculated) : 52.4  Vital Signs: Temp: 98.3 F (36.8 C) (09/18 0446) Temp Source: Axillary (09/18 0446) BP: 111/53 mmHg (09/18 0200) Pulse Rate: 90 (09/18 0100) Intake/Output from previous day: 09/17 0701 - 09/18 0700 In: 580.5 [I.V.:180.5; IV Piggyback:400] Out: 220 [Urine:220]  Labs:  Recent Labs  05/14/15 0345 05/15/15 0340  WBC 10.2 12.1*  HGB 12.6 13.6  PLT 335 301  CREATININE 0.46 0.46   Estimated Creatinine Clearance: 44.1 mL/min (by C-G formula based on Cr of 0.46).  Assessment: 81 y/o female presented to ED on 9/14 with large PE, O2 sat 50% on RA. CXR also showed bilateral infiltrates. She was initially started on ceftriaxone and azithromycin for pneumonia, but MD concerned about risk of aspiration.  Pharmacy is consulted to dose Zosyn.  9/14 >> Ceftriaxone >> 9/15 9/14 >> azithromycin >>  9/15 >> zosyn >>  Today, 05/16/2015:  Day #4 antibiotics Afebrile WBC 12.1 (9/17) SCr stable, CrCrl ~ 43 ml/min  Goal of Therapy:  Appropriate abx dosing, eradication of infection.   Plan:   Continue Zosyn 3.375g IV Q8H infused over 4hrs.   Follow up renal fxn, culture results, and clinical course.   Lynann Beaver PharmD, BCPS Pager [phone] 05/16/2015 7:09 AM

## 2015-05-16 NOTE — Progress Notes (Signed)
Daily Progress Note   Patient Name: Kim Flores       Date: 05/16/2015 DOB: 23-Mar-1930  Age: 81 y.o. MRN#: [id number] Attending Physician: Elmarie Shiley, MD Primary Care Physician: Mayra Neer, MD Admit Date: 05/04/2015  Reason for Consultation/Follow-up: Establishing goals of care and Non pain symptom management  Subjective: Pt more short of breath overnight. Increased 02 delivery. Pt when awake is agitated. No longer eating or able to take PO meds. Husband , son and DIL at bedside. Met family with Dr. Tyrell Antonio.. Reviewed comfort approach given worsening clinical condition despite antibiotics, heparin, and other supportive measures. Family now in a place of emphasis on comfort; recognizing decline despite aggressive efforts. Husband speaks again about how his wife has been telling him prior to adm how she is ready to die. That she has no quality of life. Discussed role of opioids to manage dyspnea Interval Events: Worsening shortness of breath Length of Stay: 4 days  Current Medications: Scheduled Meds:  . azithromycin  500 mg Intravenous Q24H  . feeding supplement (ENSURE ENLIVE)  237 mL Oral BID BM  . ipratropium-albuterol  3 mL Nebulization TID  . lip balm   Topical Once  . nicotine  14 mg Transdermal Daily  . piperacillin-tazobactam (ZOSYN)  IV  3.375 g Intravenous Q8H  . sodium chloride  3 mL Intravenous Q12H    Continuous Infusions:    PRN Meds: acetaminophen **OR** acetaminophen, albuterol, alum & mag hydroxide-simeth, LORazepam, morphine injection, ondansetron **OR** ondansetron (ZOFRAN) IV, senna-docusate  Palliative Performance Scale: 20%     Vital Signs: BP 108/41 mmHg  Pulse 109  Temp(Src) 98.7 F (37.1 C) (Axillary)  Resp 26  Ht _0  (1.6 m)  Wt 54.3 kg (119 lb 11.4 oz)  BMI 21.21 kg/m2  SpO2 100% SpO2: SpO2: 100 % O2 Device: O2 Device: NRB (due Sp02 = 79 on VM 55%) O2 Flow Rate: O2 Flow Rate (L/min): 15 L/min  Intake/output summary:    Intake/Output Summary (Last 24 hours) at 05/16/15 1544 Last data filed at 05/16/15 1500  Gross per 24 hour  Intake    628 ml  Output    500 ml  Net    128 ml   LBM:   Baseline Weight: Weight: 47.174 kg (104 lb) Most recent weight: Weight: 54.3 kg (119 lb 11.4 oz)  Physical Exam: General: Agitated, calling out. Resp: Increased work of breathing; retracted sternal notch Cardiac: JVD              Additional Data Reviewed: Recent Labs     05/15/15  0340  05/16/15  0830  WBC  12.1*  13.8*  HGB  13.6  12.8  PLT  301  305  NA  145  145  BUN  9  13  CREATININE  0.46  0.69     Problem List:  Patient Active Problem List   Diagnosis Date Noted  . Dementia with behavioral disturbance   . Acute encephalopathy 05/14/2015  . Hypernatremia 05/14/2015  . PNA (pneumonia) 05/14/2015  . Malnutrition of moderate degree 05/14/2015  . Palliative care encounter   . Hypoxemia   . Pulmonary embolism 04/30/2015  . Respiratory failure 05/25/2015  . Hypothyroidism 05/01/2015     Palliative Care Assessment & Plan    Code Status:  DNR  Goals of Care:  Move out of ICU  Stop heparin  Finish coarse of antibiotics then no further antibiotics  No advancement of care  Symptom Management:  Dyspnea: Cont 02  via FM as tolerated. Encourage use of opioids. Husband in agreement after being informed of risk/benenfit. Order low dose IV ms04 1-2 q2 prn. Monitor for ned for scheduled dosing  Agitation: Ativan IV q4 prn. ? If agitation driven by hypoxia. May need haldolfor additional coverage if opioids do not help  Psycho-social/Spiritual:  Desire for further Chaplaincy support:no   Prognosis: 2-4 weeks barring an acute event. Pt has PE Discharge Planning: For now moving out of ICU. Family discussing hospice in-patient if she declines further   Care plan was discussed with RN, Dr. Tyrell Antonio, and family  Thank you for allowing the Palliative Medicine Team to assist in the care of  this patient.   Time In: 1200 Time Out: 1315 Total Time 75 min Prolonged Time Billed  yes     Greater than 50%  of this time was spent counseling and coordinating care related to the above assessment and plan.   Dory Horn, NP  05/16/2015, 3:44 PM  Please contact Palliative Medicine Team phone at [phone] for questions and concerns.

## 2015-05-17 LAB — CULTURE, BLOOD (ROUTINE X 2): CULTURE: NO GROWTH

## 2015-05-29 NOTE — Care Management Important Message (Signed)
Important Message  Patient Details  Name: [name] MRN: [id number] Date of Birth: [birth year]   Medicare Important Message Given:  Yes-second notification given    Haskell Flirt 06/07/2015, 10:48 AMImportant Message  Patient Details  Name: [name] MRN: [id number] Date of Birth: 29-May-1930   Medicare Important Message Given:  Yes-second notification given    Haskell Flirt 07-Jun-2015, 10:48 AM

## 2015-05-29 NOTE — Progress Notes (Cosign Needed Addendum)
At 0523, entered room and noted pt's chest only slightly moving every 20-30 seconds. Auscultated agonal heart sounds. Called Coralie Common RN to the room and at [phone] pt had expired. Death verified by Mick Sell RN and Coralie Common RN.

## 2015-05-29 NOTE — Discharge Summary (Signed)
Death Summary  Kim Flores ZOX:[id number] DOB: [birth year] DOA: 05/19/2015  PCP: [name], MD PCP/Office notified: will send report.   Admit date: 05/28/2015 Date of Death: 05-23-15  Final Diagnoses:    Pulmonary embolism   PNA   Acute hypoxic Respiratory failure   Hypothyroidism   Hypoxemia   Acute encephalopathy   Hypernatremia   PNA (pneumonia)   Malnutrition of moderate degree   Palliative care encounter   Dementia with behavioral disturbance    History of present illness:  Kim Flores is a 81 y.o. female with a past medical history of DVT, previously had been on Xarelto therapy, cognitive impairment, hypertension, hypothyroidism, presenting to the emergency room in hypoxemic respiratory failure. Patient's husband reporting that she has had a progressive decline over the past 2-3 weeks becoming progressively weaker, having recurrent falls, right greater assistance with activities of daily living. She has had difficulties just getting around her home. Husband reports that she had a fall on Monday injuring her left lower extremity. Today she had a fall coming out of the bathroom. Her husband described her legs simply giving out. There was no loss of consciousness. She was found to be profoundly hypoxemic in the emergency department having an O2 sat of 50% on room air. She had a CT scan of lungs with IV contrast in the emergency room where radiology reported a large pulmonary embolus occluding the right lower lobe pulmonary artery. During my evaluation she was hemodynamically stable having a last blood pressure 123/62 with a pulse of 99. I discussed case with Dr Tyson Alias of pulmonary critical care medicine who recommended anticoagulation with IV heparin.   Hospital Course:  1-Acute hypoxemic respiratory failure: hypercapnic; In setting of PE and PNA;  Fail to improved on  IV antibiotics and Heparin Gtt Schedule nebulizer.  NB mask.   Chest x ray with pulmonary edema. Will have to be careful with lasix due to hypotension and risk of decreasing right side pressure.  Discussion with family, comfort care goals of care.   2-Pulmonary embolism:  Treated with heparin Gtt.  CCM was following. No candidate  for TPA. Discussed with Dr Jamison Neighbor no thoracentesis indicated.  ECHO; with mild atrial dilation, decrease right ventricul function.  On heparin Gtt.. Palliative care following. Meeting with palliative care and family. Goal is comfort care.   3-PNA community vs aspiration. :  Chest x ray with bilateral infiltrates.  Treated with Azithromycin and zosyn.  Speech evaluation.  Blood culture pending.   4-Hypernatremia: resume diet. Encourage oral intake,. NSL. Improved.   5-Hypothyroidism. TSH low at 0.34. Check free t 3 and T 4 and adjust synthroid as needed.   synthroid  IV.   6-Functional decline/failure to thrive. Multifactorial, acute illness, PE, PNA.    7-Hypertension; hold BP medications.   8-6 mm nodule in the left upper lobe; she will need follo up scan.   9-UTI; zosyn should cover. Urine culture multiple bacterial morphotype's.   10-Encephalopathy; and underline dementia. Delirium, encephalopathy secondary to acute illness. Will change ativan to IV PRN.    Time: of death ; 5;23 AM.   Signed:  Regalado, Jon Billings A  [facility] 05-23-2015, 3:57 PM

## 2015-05-29 NOTE — Progress Notes (Signed)
Advised by patient placement to contact M.E. Regarding this case due to falls at home prior to admission. Spoke with M.E. Tamala Bari, and he wants to contact state examiner to review case before making decision. Asked [place] to not release body to the funeral home and only take it to the morgue until he has called back with further instructions. Mick Sell RN

## 2015-05-29 DEATH — deceased

## 2016-09-07 IMAGING — CT CT ANGIO CHEST
2 of 6 series · 18 of 36 positions shown · IV contrast (OMNIPAQUE 350)
Comparison: Chest x-rays dated 05/12/2015 and 08/31/2014

CLINICAL DATA: Hypoxemia.  Bilateral pleural effusions.

EXAM:
CT ANGIOGRAPHY CHEST WITH CONTRAST
TECHNIQUE: Multidetector CT imaging of the chest was performed using the
standard protocol during bolus administration of intravenous
contrast. Multiplanar CT image reconstructions and MIPs were
obtained to evaluate the vascular anatomy.
CONTRAST:  100mL OMNIPAQUE IOHEXOL 350 MG/ML SOLN

[Series 6: thins for pacs · axial · 0.58mm/px · z∈[-500,-256]mm · 17 of 272 slices shown]
[im 14/272  lung]
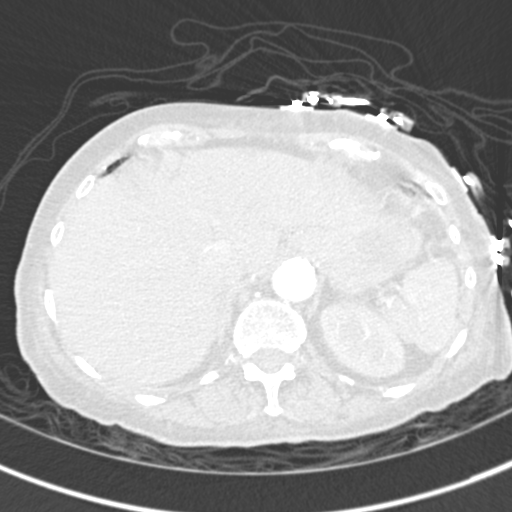
[im 28/272  mediastinal]
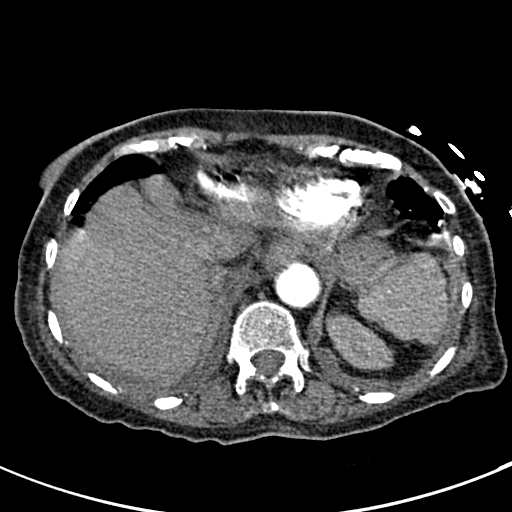
[im 41/272  lung]
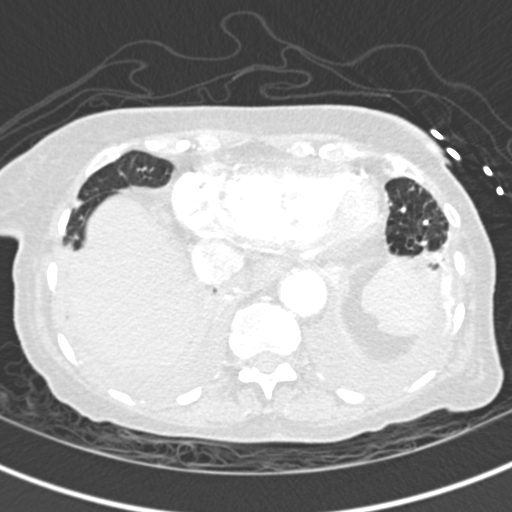
[im 55/272  mediastinal]
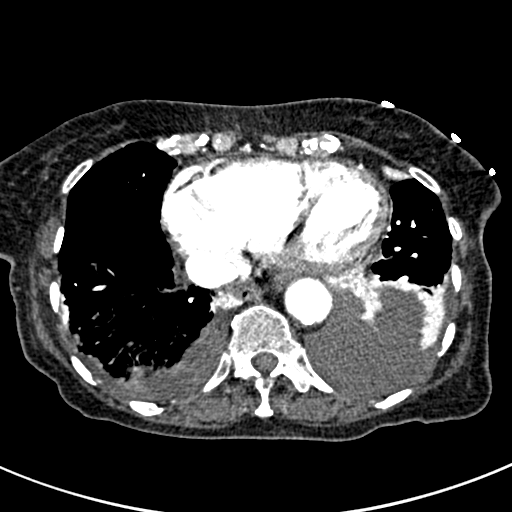
[im 82/272  lung]
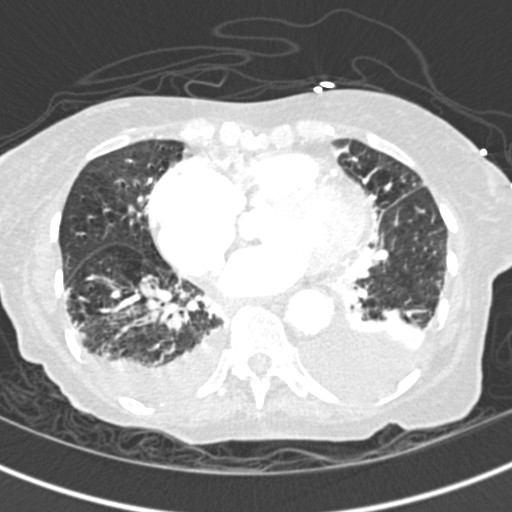
[im 95/272  mediastinal]
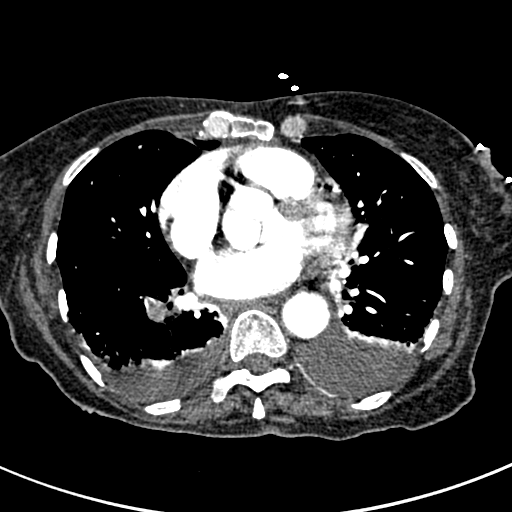
[im 109/272  lung]
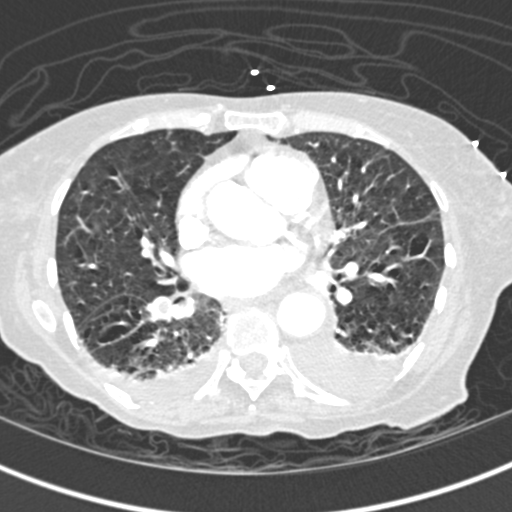
[im 122/272  mediastinal]
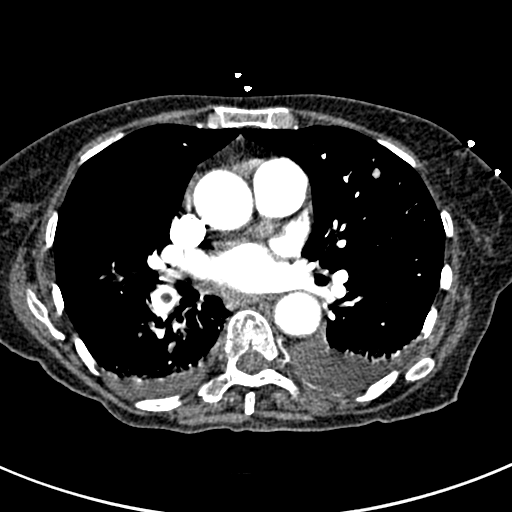
[im 136/272  lung]
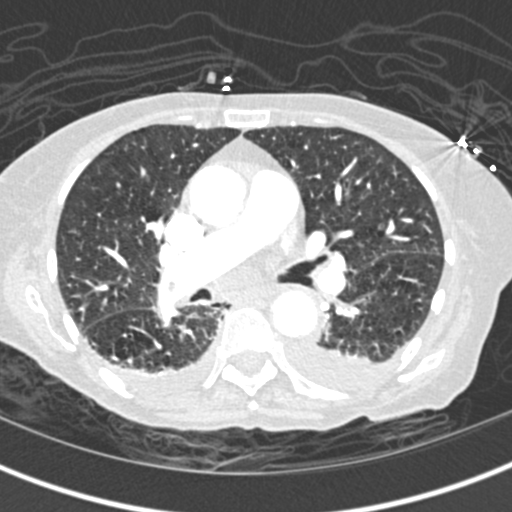
[im 150/272  mediastinal]
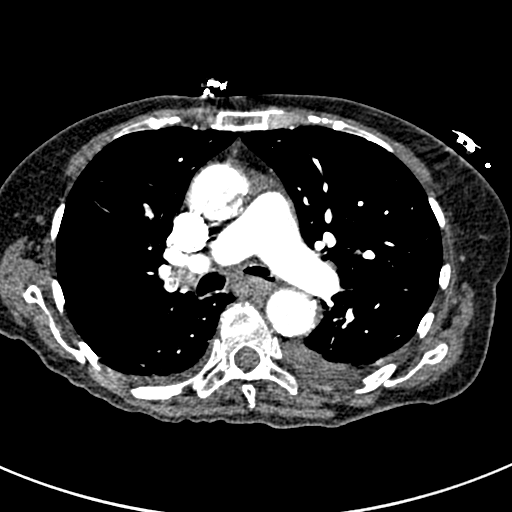
[im 163/272  lung]
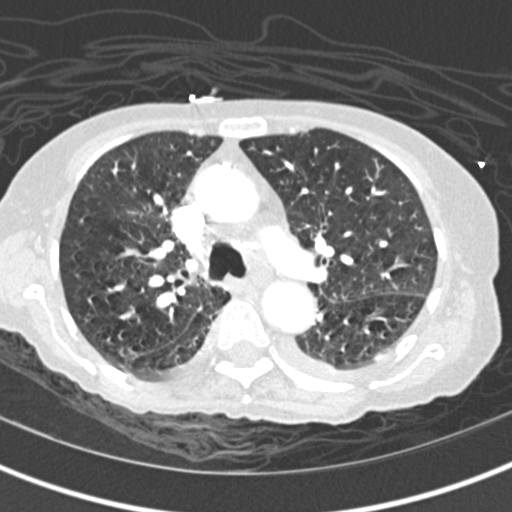
[im 177/272  mediastinal]
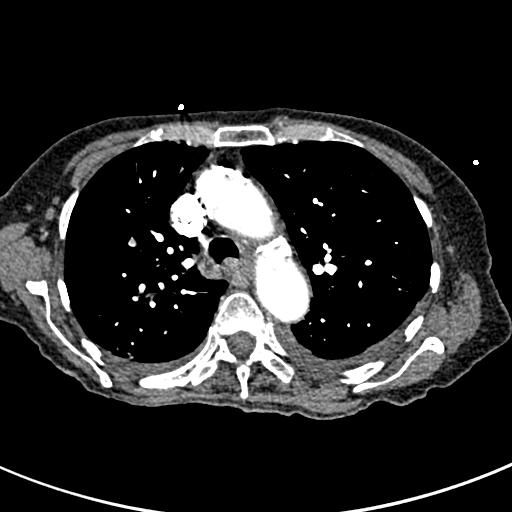
[im 190/272  lung]
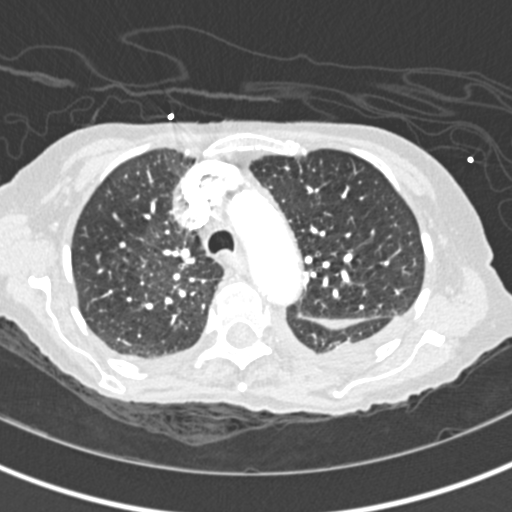
[im 217/272  mediastinal]
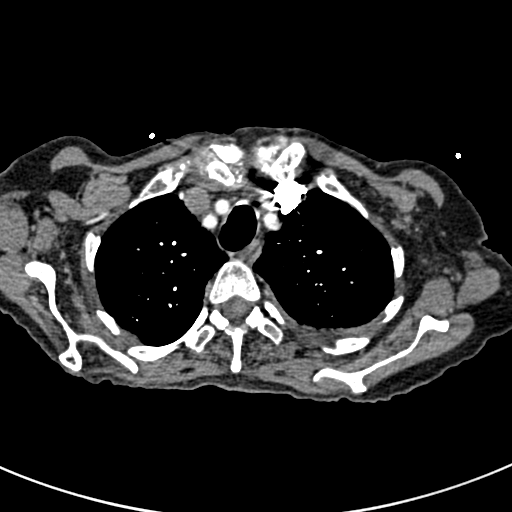
[im 231/272  lung]
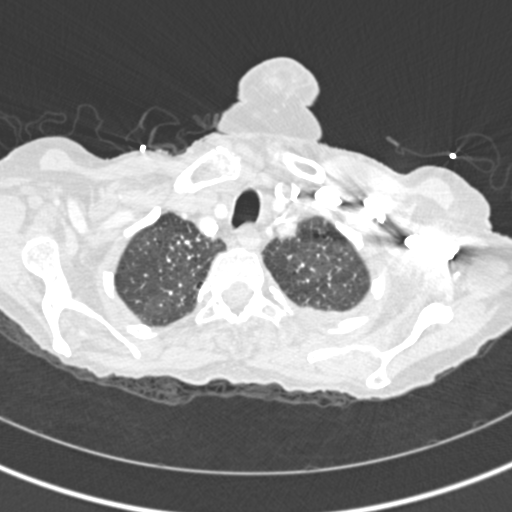
[im 244/272  mediastinal]
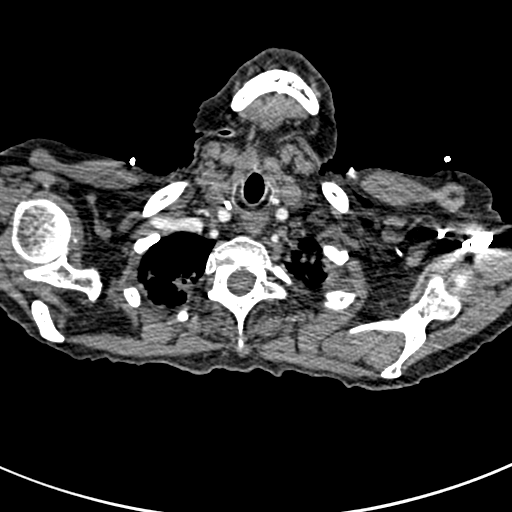
[im 258/272  lung]
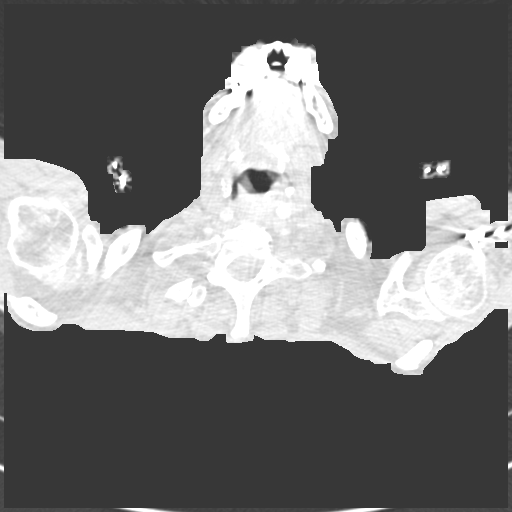

[Series 8: coronal mpr · coronal · 0.55mm/px · 1 of 104 slices shown]
[im 52/104  mediastinal]
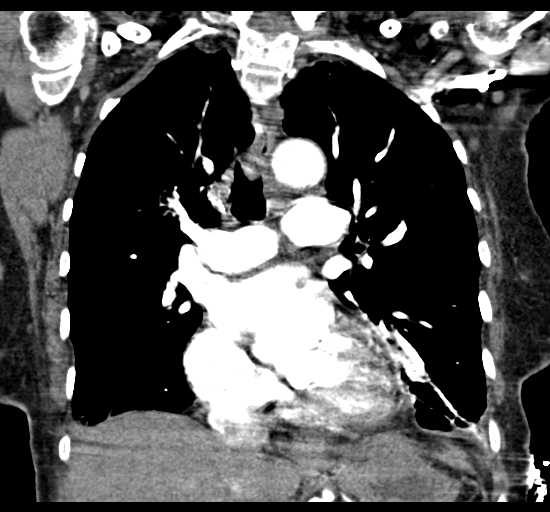

[18 of 36 positions shown; findings below may reference images not displayed]

FINDINGS: There is a large pulmonary embolus occluding the right lower lobe
pulmonary artery. The embolus also extends into a posterior branch
of the right upper lobe pulmonary artery although this is not
occlusive.

The patient also has a 19 x 14 x 11 mm thrombus on the medial wall
of the ascending thoracic aorta 5 cm above the aortic valve.

There is extensive coronary artery calcification. RV LV ratio is
normal.

There is a 6 mm smoothly marginated nodule in the left upper lobe
just above the lingula. No other pulmonary nodules.

There are moderate bilateral pleural effusions with slight
compressive atelectasis in the left lower lobe. Extensive aortic
atherosclerosis. No acute osseous abnormalities. The visualized
portion of the upper abdomen is normal.

Review of the MIP images confirms the above findings.
IMPRESSION: 1. Large pulmonary embolus occluding the right lower lobe pulmonary
arteries. The embolus also extends into the posterior branch of the
right upper lobe pulmonary artery.
2. Large thrombus on the medial wall of the ascending thoracic
aorta.
3. 6 mm nodule in the left upper lobe. If the patient is at high
risk for bronchogenic carcinoma, follow-up chest CT at 6-12 months
is recommended. If the patient is at low risk for bronchogenic
carcinoma, follow-up chest CT at 12 months is recommended. This
recommendation follows the consensus statement: Guidelines for
Management of Small Pulmonary Nodules Detected on CT Scans: A
Statement from the [HOSPITAL] as published in Radiology
4. Moderate bilateral pleural effusions, left greater than right.
